# Patient Record
Sex: Female | Born: 1955
Health system: Southern US, Community
[De-identification: ages and names within clinical notes are randomized; demographics above are authoritative.]

## PROBLEM LIST (undated history)

## (undated) DIAGNOSIS — F419 Anxiety disorder, unspecified: Secondary | ICD-10-CM

## (undated) DIAGNOSIS — F32A Depression, unspecified: Secondary | ICD-10-CM

## (undated) DIAGNOSIS — G40909 Epilepsy, unspecified, not intractable, without status epilepticus: Secondary | ICD-10-CM

## (undated) DIAGNOSIS — G4700 Insomnia, unspecified: Secondary | ICD-10-CM

## (undated) DIAGNOSIS — E039 Hypothyroidism, unspecified: Secondary | ICD-10-CM

## (undated) DIAGNOSIS — E785 Hyperlipidemia, unspecified: Secondary | ICD-10-CM

## (undated) DIAGNOSIS — I10 Essential (primary) hypertension: Secondary | ICD-10-CM

## (undated) DIAGNOSIS — F329 Major depressive disorder, single episode, unspecified: Secondary | ICD-10-CM

## (undated) DIAGNOSIS — E0520 Thyrotoxicosis with toxic multinodular goiter without thyrotoxic crisis or storm: Secondary | ICD-10-CM

## (undated) HISTORY — DX: Hyperlipidemia, unspecified: E78.5

## (undated) HISTORY — DX: Depression, unspecified: F32.A

## (undated) HISTORY — PX: THYROIDECTOMY: SHX17

## (undated) HISTORY — DX: Insomnia, unspecified: G47.00

## (undated) HISTORY — DX: Major depressive disorder, single episode, unspecified: F32.9

## (undated) HISTORY — DX: Hypothyroidism, unspecified: E03.9

## (undated) HISTORY — DX: Anxiety disorder, unspecified: F41.9

---

## 2003-10-17 ENCOUNTER — Emergency Department (HOSPITAL_COMMUNITY): Admission: EM | Admit: 2003-10-17 | Discharge: 2003-10-17 | Payer: Self-pay | Admitting: Emergency Medicine

## 2005-04-28 ENCOUNTER — Ambulatory Visit: Payer: Self-pay | Admitting: Family Medicine

## 2005-05-26 ENCOUNTER — Encounter (HOSPITAL_COMMUNITY): Admission: RE | Admit: 2005-05-26 | Discharge: 2005-06-25 | Payer: Self-pay | Admitting: Family Medicine

## 2005-05-29 ENCOUNTER — Emergency Department (HOSPITAL_COMMUNITY): Admission: EM | Admit: 2005-05-29 | Discharge: 2005-05-29 | Payer: Self-pay | Admitting: Emergency Medicine

## 2005-07-01 ENCOUNTER — Emergency Department (HOSPITAL_COMMUNITY): Admission: EM | Admit: 2005-07-01 | Discharge: 2005-07-01 | Payer: Self-pay | Admitting: Emergency Medicine

## 2005-11-13 ENCOUNTER — Emergency Department: Payer: Self-pay | Admitting: Emergency Medicine

## 2006-02-22 ENCOUNTER — Emergency Department: Payer: Self-pay | Admitting: Emergency Medicine

## 2006-03-02 ENCOUNTER — Ambulatory Visit: Payer: Self-pay | Admitting: Otolaryngology

## 2006-03-07 ENCOUNTER — Other Ambulatory Visit: Payer: Self-pay

## 2006-03-23 ENCOUNTER — Ambulatory Visit: Payer: Self-pay | Admitting: Otolaryngology

## 2006-04-13 ENCOUNTER — Emergency Department: Payer: Self-pay | Admitting: Emergency Medicine

## 2006-07-26 ENCOUNTER — Emergency Department: Payer: Self-pay | Admitting: Unknown Physician Specialty

## 2006-07-30 ENCOUNTER — Other Ambulatory Visit: Payer: Self-pay

## 2006-07-30 ENCOUNTER — Observation Stay: Payer: Self-pay | Admitting: *Deleted

## 2007-02-15 ENCOUNTER — Emergency Department: Payer: Self-pay | Admitting: Emergency Medicine

## 2007-09-02 ENCOUNTER — Observation Stay: Payer: Self-pay | Admitting: Internal Medicine

## 2007-09-06 ENCOUNTER — Emergency Department: Payer: Self-pay | Admitting: Emergency Medicine

## 2007-12-10 ENCOUNTER — Emergency Department (HOSPITAL_COMMUNITY): Admission: EM | Admit: 2007-12-10 | Discharge: 2007-12-11 | Payer: Self-pay | Admitting: Emergency Medicine

## 2008-10-06 ENCOUNTER — Inpatient Hospital Stay (HOSPITAL_COMMUNITY): Admission: EM | Admit: 2008-10-06 | Discharge: 2008-10-06 | Payer: Self-pay | Admitting: Emergency Medicine

## 2008-10-07 ENCOUNTER — Emergency Department: Payer: Self-pay | Admitting: Emergency Medicine

## 2008-10-10 ENCOUNTER — Emergency Department (HOSPITAL_COMMUNITY): Admission: EM | Admit: 2008-10-10 | Discharge: 2008-10-10 | Payer: Self-pay | Admitting: Emergency Medicine

## 2008-12-15 ENCOUNTER — Emergency Department: Payer: Self-pay | Admitting: Emergency Medicine

## 2009-01-08 ENCOUNTER — Ambulatory Visit: Payer: Self-pay | Admitting: Neurology

## 2009-03-05 ENCOUNTER — Emergency Department (HOSPITAL_COMMUNITY): Admission: EM | Admit: 2009-03-05 | Discharge: 2009-03-06 | Payer: Self-pay | Admitting: Emergency Medicine

## 2009-09-29 ENCOUNTER — Emergency Department (HOSPITAL_COMMUNITY): Admission: EM | Admit: 2009-09-29 | Discharge: 2009-09-30 | Payer: Self-pay | Admitting: Emergency Medicine

## 2009-10-01 ENCOUNTER — Emergency Department (HOSPITAL_COMMUNITY): Admission: EM | Admit: 2009-10-01 | Discharge: 2009-10-01 | Payer: Self-pay | Admitting: Emergency Medicine

## 2010-02-27 ENCOUNTER — Emergency Department (HOSPITAL_COMMUNITY): Admission: EM | Admit: 2010-02-27 | Discharge: 2010-02-27 | Payer: Self-pay | Admitting: Emergency Medicine

## 2010-03-02 ENCOUNTER — Emergency Department: Payer: Self-pay | Admitting: Emergency Medicine

## 2010-05-04 ENCOUNTER — Ambulatory Visit (HOSPITAL_COMMUNITY): Admission: RE | Admit: 2010-05-04 | Discharge: 2010-05-04 | Payer: Self-pay | Admitting: Internal Medicine

## 2010-05-15 ENCOUNTER — Emergency Department: Payer: Self-pay | Admitting: Emergency Medicine

## 2010-05-16 ENCOUNTER — Emergency Department (HOSPITAL_COMMUNITY): Admission: EM | Admit: 2010-05-16 | Discharge: 2010-05-16 | Payer: Self-pay | Admitting: Emergency Medicine

## 2010-07-08 ENCOUNTER — Ambulatory Visit: Payer: Self-pay | Admitting: Surgery

## 2010-07-12 ENCOUNTER — Ambulatory Visit: Payer: Self-pay | Admitting: Surgery

## 2010-10-16 LAB — URINALYSIS, ROUTINE W REFLEX MICROSCOPIC
Bilirubin Urine: NEGATIVE
Glucose, UA: NEGATIVE mg/dL
Ketones, ur: NEGATIVE mg/dL
Nitrite: NEGATIVE
Protein, ur: NEGATIVE mg/dL
Specific Gravity, Urine: 1.015 (ref 1.005–1.030)
Urobilinogen, UA: 0.2 mg/dL (ref 0.0–1.0)
pH: 5.5 (ref 5.0–8.0)

## 2010-10-16 LAB — DIFFERENTIAL
Basophils Absolute: 0.1 10*3/uL (ref 0.0–0.1)
Basophils Relative: 1 % (ref 0–1)
Eosinophils Absolute: 0.2 10*3/uL (ref 0.0–0.7)
Eosinophils Relative: 2 % (ref 0–5)
Lymphocytes Relative: 21 % (ref 12–46)
Lymphs Abs: 2.3 10*3/uL (ref 0.7–4.0)
Monocytes Absolute: 0.9 10*3/uL (ref 0.1–1.0)
Monocytes Relative: 8 % (ref 3–12)
Neutro Abs: 7.3 10*3/uL (ref 1.7–7.7)
Neutrophils Relative %: 68 % (ref 43–77)

## 2010-10-16 LAB — LIPASE, BLOOD: Lipase: 29 U/L (ref 11–59)

## 2010-10-16 LAB — CBC
HCT: 37.4 % (ref 36.0–46.0)
Hemoglobin: 13.1 g/dL (ref 12.0–15.0)
MCH: 31.9 pg (ref 26.0–34.0)
MCHC: 35.1 g/dL (ref 30.0–36.0)
MCV: 91 fL (ref 78.0–100.0)
Platelets: 230 10*3/uL (ref 150–400)
RBC: 4.11 MIL/uL (ref 3.87–5.11)
RDW: 12.7 % (ref 11.5–15.5)
WBC: 10.7 10*3/uL — ABNORMAL HIGH (ref 4.0–10.5)

## 2010-10-16 LAB — COMPREHENSIVE METABOLIC PANEL
ALT: 26 U/L (ref 0–35)
AST: 23 U/L (ref 0–37)
Albumin: 3.9 g/dL (ref 3.5–5.2)
Alkaline Phosphatase: 37 U/L — ABNORMAL LOW (ref 39–117)
BUN: 9 mg/dL (ref 6–23)
CO2: 26 mEq/L (ref 19–32)
Calcium: 8.7 mg/dL (ref 8.4–10.5)
Chloride: 101 mEq/L (ref 96–112)
Creatinine, Ser: 0.87 mg/dL (ref 0.4–1.2)
GFR calc Af Amer: >60 mL/min (ref >60)
GFR calc non Af Amer: >60 mL/min (ref >60)
Glucose, Bld: 124 mg/dL — ABNORMAL HIGH (ref 70–99)
Potassium: 3.8 mEq/L (ref 3.5–5.1)
Sodium: 134 mEq/L — ABNORMAL LOW (ref 135–145)
Total Bilirubin: 0.4 mg/dL (ref 0.3–1.2)
Total Protein: 7.1 g/dL (ref 6.0–8.3)

## 2010-10-16 LAB — URINE MICROSCOPIC-ADD ON

## 2010-10-16 LAB — URINE CULTURE: Colony Count: >=100000

## 2010-10-16 LAB — POCT PREGNANCY, URINE: Preg Test, Ur: NEGATIVE

## 2010-10-24 ENCOUNTER — Emergency Department (HOSPITAL_COMMUNITY)
Admission: EM | Admit: 2010-10-24 | Discharge: 2010-10-24 | Disposition: A | Discharge status: Home / Self Care | Payer: Medicare Other | Attending: Emergency Medicine | Admitting: Emergency Medicine

## 2010-10-24 ENCOUNTER — Emergency Department (HOSPITAL_COMMUNITY): Payer: Medicare Other

## 2010-10-24 DIAGNOSIS — Z79899 Other long term (current) drug therapy: Secondary | ICD-10-CM | POA: Insufficient documentation

## 2010-10-24 DIAGNOSIS — E1169 Type 2 diabetes mellitus with other specified complication: Secondary | ICD-10-CM | POA: Insufficient documentation

## 2010-10-24 DIAGNOSIS — R002 Palpitations: Secondary | ICD-10-CM | POA: Insufficient documentation

## 2010-10-24 DIAGNOSIS — E039 Hypothyroidism, unspecified: Secondary | ICD-10-CM | POA: Insufficient documentation

## 2010-10-24 DIAGNOSIS — I10 Essential (primary) hypertension: Secondary | ICD-10-CM | POA: Insufficient documentation

## 2010-10-24 LAB — BASIC METABOLIC PANEL
BUN: 13 mg/dL (ref 6–23)
CO2: 27 mEq/L (ref 19–32)
Calcium: 8.8 mg/dL (ref 8.4–10.5)
GFR calc Af Amer: >60 mL/min (ref >60)
GFR calc non Af Amer: >60 mL/min (ref >60)
Glucose, Bld: 226 mg/dL — ABNORMAL HIGH (ref 70–99)
Potassium: 3.9 mEq/L (ref 3.5–5.1)
Sodium: 134 mEq/L — ABNORMAL LOW (ref 135–145)

## 2010-10-24 LAB — CBC
HCT: 38.3 % (ref 36.0–46.0)
Hemoglobin: 13.5 g/dL (ref 12.0–15.0)
MCH: 30 pg (ref 26.0–34.0)
MCHC: 35.2 g/dL (ref 30.0–36.0)
MCV: 85.1 fL (ref 78.0–100.0)
RBC: 4.5 MIL/uL (ref 3.87–5.11)
RDW: 12.5 % (ref 11.5–15.5)

## 2010-10-24 LAB — DIFFERENTIAL
Basophils Absolute: 0 10*3/uL (ref 0.0–0.1)
Basophils Relative: 1 % (ref 0–1)
Eosinophils Absolute: 0.2 10*3/uL (ref 0.0–0.7)
Lymphocytes Relative: 33 % (ref 12–46)
Monocytes Absolute: 0.6 10*3/uL (ref 0.1–1.0)
Neutro Abs: 4.3 10*3/uL (ref 1.7–7.7)
Neutrophils Relative %: 57 % (ref 43–77)

## 2010-10-24 LAB — POCT CARDIAC MARKERS
Myoglobin, poc: 14.1 ng/mL (ref 12–200)
Troponin i, poc: <0.05 ng/mL (ref 0.00–0.09)

## 2010-11-11 LAB — COMPREHENSIVE METABOLIC PANEL
ALT: 15 U/L (ref 0–35)
Albumin: 3.9 g/dL (ref 3.5–5.2)
Alkaline Phosphatase: 36 U/L — ABNORMAL LOW (ref 39–117)
BUN: 16 mg/dL (ref 6–23)
Chloride: 106 mEq/L (ref 96–112)
Glucose, Bld: 115 mg/dL — ABNORMAL HIGH (ref 70–99)
Potassium: 3.6 mEq/L (ref 3.5–5.1)
Sodium: 143 mEq/L (ref 135–145)
Total Bilirubin: 0.5 mg/dL (ref 0.3–1.2)

## 2010-11-11 LAB — DIFFERENTIAL
Basophils Absolute: 0 10*3/uL (ref 0.0–0.1)
Basophils Absolute: 0.1 10*3/uL (ref 0.0–0.1)
Basophils Relative: 1 % (ref 0–1)
Basophils Relative: 2 % — ABNORMAL HIGH (ref 0–1)
Eosinophils Absolute: 0.2 10*3/uL (ref 0.0–0.7)
Eosinophils Absolute: 0.1 10*3/uL (ref 0.0–0.7)
Eosinophils Relative: 3 % (ref 0–5)
Lymphocytes Relative: 33 % (ref 12–46)
Lymphs Abs: 1.9 10*3/uL (ref 0.7–4.0)
Monocytes Absolute: 0.4 10*3/uL (ref 0.1–1.0)
Monocytes Absolute: 0.3 10*3/uL (ref 0.1–1.0)
Monocytes Relative: 7 % (ref 3–12)
Monocytes Relative: 7 % (ref 3–12)
Neutro Abs: 3.3 10*3/uL (ref 1.7–7.7)
Neutro Abs: 2.2 10*3/uL (ref 1.7–7.7)
Neutrophils Relative %: 57 % (ref 43–77)

## 2010-11-11 LAB — CBC
HCT: 35.8 % — ABNORMAL LOW (ref 36.0–46.0)
HCT: 33.5 % — ABNORMAL LOW (ref 36.0–46.0)
Hemoglobin: 12.9 g/dL (ref 12.0–15.0)
Hemoglobin: 11.9 g/dL — ABNORMAL LOW (ref 12.0–15.0)
MCHC: 35.3 g/dL (ref 30.0–36.0)
MCV: 93.1 fL (ref 78.0–100.0)
Platelets: 199 10*3/uL (ref 150–400)
Platelets: 170 10*3/uL (ref 150–400)
RBC: 3.85 MIL/uL — ABNORMAL LOW (ref 3.87–5.11)
RDW: 13.9 % (ref 11.5–15.5)
WBC: 5.8 10*3/uL (ref 4.0–10.5)
WBC: 4.5 10*3/uL (ref 4.0–10.5)

## 2010-11-11 LAB — BASIC METABOLIC PANEL
BUN: 14 mg/dL (ref 6–23)
CO2: 28 mEq/L (ref 19–32)
Calcium: 9.3 mg/dL (ref 8.4–10.5)
Chloride: 103 mEq/L (ref 96–112)
Creatinine, Ser: 1.15 mg/dL (ref 0.4–1.2)
GFR calc Af Amer: >60 mL/min (ref >60)
GFR calc non Af Amer: 50 mL/min — ABNORMAL LOW (ref >60)
Glucose, Bld: 107 mg/dL — ABNORMAL HIGH (ref 70–99)
Potassium: 3.4 mEq/L — ABNORMAL LOW (ref 3.5–5.1)
Sodium: 139 mEq/L (ref 135–145)

## 2010-11-11 LAB — POCT CARDIAC MARKERS
CKMB, poc: 3.3 ng/mL (ref 1.0–8.0)
Myoglobin, poc: 37 ng/mL (ref 12–200)
Troponin i, poc: <0.05 ng/mL (ref 0.00–0.09)

## 2010-11-11 LAB — RAPID URINE DRUG SCREEN, HOSP PERFORMED
Amphetamines: NOT DETECTED
Benzodiazepines: NOT DETECTED
Cocaine: NOT DETECTED
Opiates: POSITIVE — AB
Tetrahydrocannabinol: NOT DETECTED

## 2010-11-11 LAB — URINALYSIS, ROUTINE W REFLEX MICROSCOPIC
Bilirubin Urine: NEGATIVE
Hgb urine dipstick: NEGATIVE
Ketones, ur: NEGATIVE mg/dL
Nitrite: NEGATIVE
Urobilinogen, UA: 0.2 mg/dL (ref 0.0–1.0)

## 2010-11-11 LAB — URINE MICROSCOPIC-ADD ON

## 2010-11-11 LAB — ETHANOL: Alcohol, Ethyl (B): <5 mg/dL (ref 0–10)

## 2010-12-14 NOTE — H&P (Signed)
Sherry Fields, Sherry Fields NO.:  [id number]   MEDICAL RECORD NO.:  [id number]          PATIENT TYPE:  INP   LOCATION:  A309                          FACILITY:  [facility]   PHYSICIAN:  Osvaldo Shipper, MD     DATE OF BIRTH:  07-26-1956   DATE OF ADMISSION:  10/06/2008  DATE OF DISCHARGE:  03/08/2010LH                              HISTORY & PHYSICAL   ADDENDUM:  The patient underwent CT of the head as mentioned on my other  dictation and this revealed two areas of hypoattenuation on the right  side of the brain.  The radiologist felt that this could be  calcification or on the other hand could be petechial hemorrhages.  A  short follow-up was recommended with CT head the next 12-24 hours.  The  patient was very hypertensive.  In view of this finding. I gave orders  to transfer the patient to the intensive care unit.  In view of the  possible QT prolongation on the EKG,  I also ordered a cardiology  consultation.  A neurology consultation was also obtained because of the  abnormal CT findings.  Consideration was also being given to getting an  MRI in the morning.  However, I got a call from the nursing station on  the third floor saying that the patient's family wanted to talk to me.  I spoke to the patient's daughter who then went on to question as to why  this patient was admitted.  I explained to her the abnormal CT findings  and her presentation and told her that this could be quite concerning  and dangerous for the patient.  She then demanded to see the report of  the CT scan which I was able to provide.  She demanded to see the  imaging studies and so I showed her the two spots the radiologist was  concerned about.  She then demanded to speak with the radiologist.  I  have told her that that would not be possible since the radiologist was  located in a remote site.  She said why we could not repeat the CT right  away to see if these findings were there or not.  I told her  that we  want to rule out evolution of these abnormalities and the radiologist  recommended follow-up in 12 hours.  The daughter then went on to mention  that they have lost many family members to this hospital.  your  hospital has killed many of our relatives.  She then said that if we  take the patient to [place] and the CT scan  does not show these  findings that we have found here, can we then sue you?  The patient's  daughter seems to be quite confrontational.  I tried to reason with her  and tried to explain as to why we wanted to keep the patient in the  intensive care unit and why she was admitted, but the daughter refused  to reason.  I have offered her that if she did not feel comfortable with  the care  in this hospital, I can try calling folks at [facility].  After  much thought and discussion between other family members and between the  daughter and the patient, they said that they will stay back in this  hospital.  And then I got a call about 10 minutes later from the nurse  that they wanted to leave against medical advice and they did not wish  to speak to me any further.  The consequences of doing so were explained  to them both by myself and by the nurse taking care of the patient.      Osvaldo Shipper, MD  Electronically Signed     GK/MEDQ  D:  10/07/2008  T:  10/07/2008  Job:  [id number]

## 2010-12-14 NOTE — H&P (Signed)
Sherry Fields, IACOVELLI NO.:  [id number]   MEDICAL RECORD NO.:  [id number]          PATIENT TYPE:  INP   LOCATION:  A309                          FACILITY:  [facility]   PHYSICIAN:  Osvaldo Shipper, MD     DATE OF BIRTH:  Sep 22, 1955   DATE OF ADMISSION:  10/06/2008  DATE OF DISCHARGE:  LH                              HISTORY & PHYSICAL   The patient's PMD is Dr. Carmelia Roller in [place].   ADMISSION DIAGNOSES:  1. Syncope, unclear etiology.  2. Right hand numbness, unclear etiology.  3. History of hypothyroidism secondary to surgery.  4. Overweight.  5. Possible prolonged QT interval.   CHIEF COMPLAINT:  Passed out.   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  She was in her usual  state of health until this afternoon when she was in her living room.  She was sitting on the couch.  She got up, felt a little dizzy, went to  the bathroom, urinated and then she walked to the kitchen and when she  was walking back to the living room she felt dizzy again and passed out.  Denies any chest pain, shortness of breath, palpitations.  Denies any  seizure-type activity.  There was family present at the room when this  happened and they did not report anything according to the patient.  She  denied any fever, chills, nausea, vomiting, diarrhea.  Denies any vision  loss, weight loss.  She does admit to weight gain over the past many  months.  She denies any focal weakness, however has been experiencing  right hand numbness for the last 4-5 days.  She describes the numbness  in all of her fingers with sharp neuropathic pain but no pain in the  wrist and no pain in the forearm.   MEDICATIONS AT HOME:  Levothyroxine 125 mcg once a day.  She has been on  this for a week.   She said she lost insurance and so could not afford to  take it and then about a week ago her PMD put her back on this  medication.  She denies taking any other medications at home.   ALLERGIES:  No known drug allergies.   PAST MEDICAL HISTORY:  Positive for a thyroidectomy for toxic nodular  goiter done in 2009 and she is currently on thyroid replacement.  Denies  any other surgeries.  No other medical history in the past.  Denies any  heart disease, lung disease, strokes, anything of that nature in the  past.  Denies any history of diabetes.   SOCIAL HISTORY:  Lives in [place] with her daughter and son.  No  smoking.  Occasional alcohol use.  No illicit drug use.  Does not work.  Ambulatory and independent otherwise.   FAMILY HISTORY:  Positive for diabetes, hypertension.   REVIEW OF SYSTEMS:  GENERAL:  Positive for weakness, malaise.  Continues  to have a feeling of lethargy.  HEENT:  Unremarkable.  CARDIOVASCULAR:  As in HPI.  GI:  Unremarkable.  GU:  Unremarkable.  RESPIRATORY:  As in  HPI.  MUSCULOSKELETAL:  Unremarkable.  NEUROLOGICAL:  Positive for mild  headache in the frontal area but denies any vision changes, otherwise as  in HPI.  Also the numbness as in HPI.  PSYCHIATRIC:  Unremarkable.  DERMATOLOGICAL:  Unremarkable.   PHYSICAL EXAMINATION:  VITAL SIGNS:  Temperature 98.0, blood pressure  when she came in was 146/99, it increased to 176/107, heart rate in the  70s, regular, respiratory rate is 22, saturation 96% on room air.  GENERAL EXAM:  An overweight, obese white female in no distress.  HEENT: The head is atraumatic, no other concerning lesions noted and no  pallor, no icterus.  Pupils are equal reacting.  No nystagmus noted.  Tongue is midline.  No oral lesions are noted.  NECK:  Soft and supple.  No thyromegaly is appreciated.  LUNGS:  Clear to auscultation bilaterally.  No wheezing, rales or  rhonchi.  CARDIOVASCULAR:  S1, S2 is normal.  Rate regular.   No murmurs  appreciated.  No S3, S4.  No rubs.  No bruits.  ABDOMEN:  Soft, nontender, nondistended.  Bowel sounds are present.  No  masses or organomegaly is appreciated.  EXTREMITIES:  Show actually minimal pedal edema bilaterally.  MUSCULOSKELETAL:  Exam otherwise unremarkable.  NEUROLOGIC:  She is alert, oriented x3.  No focal neurological deficits  present.  Examination of the right upper extremity does not reveal any  swelling per se.  No obvious deformity noted.  Extension of the wrist  does not produce any pain in the fingers and the numbness she mentioned  is across all fingers.  Sensory exam does reveal some evidence for some  deficit in the fingers, though it was a tough exam.   LABORATORIES:  CBC shows a normal white count, hemoglobin is 12.9,  platelet count is 199.  Potassium 3.4, glucose 107, BUN, creatinine is  normal.  Cardiac markers are negative x1.  Chest x-ray shows no acute  cardiopulmonary process.  EKG shows a sinus rhythm with possibly normal  axis.  PR interval is normal.  I am suspecting a prolonged QT especially  if you look in lead I and II.  Lead V5 has a very wavering baseline, so  possible prolonged QT though it is a tough EKG.  No other acute changes  are noted.   ASSESSMENT:  This is a 55 year old Caucasian female who presents with  syncope.  Differential diagnosis includes orthostatic hypotension.  Considering her right hand numbness, she could have some intracranial  process as well.  This could be cardiogenic considering her abnormal  electrocardiogram.   PLAN:  1. Syncope.  We will repeat electrocardiogram.  We will cycle her      cardiac enzymes.  Get a CT head as it has not been done yet.  If      the CT is negative MRI of the brain will have to be pursued as I am      really concerned about the right hand numbness.  Orthostatics will      be checked.  This does not sound like seizure activity, especially      considering the fact that she did  have witnesses present, but if      all other workup is negative, electroencephalogram will have  to be      considered. Echocardiogram, carotid Dopplers will also be checked.  2. Thyroid deficiency.  We will go ahead and check TSH and free T4 to      make sure she is being replaced adequately.  She has been started      on a very high dose but she of course does not have a thyroid, so      we want to make sure that she is not being over corrected here.  3. Hypokalemia.  She will be replaced.  Magnesium level will be      checked.  4. Further management decisions will depend on results of further      testing and patient's response to treatment.   Also note that possible prolonged QT.  We will repeat electrocardiogram  in the morning and if it continues to show this abnormality, we will  consult Cardiology to see her.      Osvaldo Shipper, MD  Electronically Signed     GK/MEDQ  D:  10/06/2008  T:  10/06/2008  Job:  [id number]   cc:   Carmelia Roller, Dr.  Lewayne Bunting

## 2011-01-13 ENCOUNTER — Emergency Department (HOSPITAL_COMMUNITY)
Admission: EM | Admit: 2011-01-13 | Discharge: 2011-01-13 | Discharge status: Against Medical Advice | Payer: Medicare Other | Attending: Emergency Medicine | Admitting: Emergency Medicine

## 2011-01-13 ENCOUNTER — Emergency Department (HOSPITAL_COMMUNITY): Payer: Medicare Other

## 2011-01-13 ENCOUNTER — Emergency Department (HOSPITAL_COMMUNITY)
Admission: EM | Admit: 2011-01-13 | Discharge: 2011-01-13 | Disposition: A | Discharge status: Home / Self Care | Payer: Medicare Other | Attending: Emergency Medicine | Admitting: Emergency Medicine

## 2011-01-13 DIAGNOSIS — I10 Essential (primary) hypertension: Secondary | ICD-10-CM | POA: Insufficient documentation

## 2011-01-13 DIAGNOSIS — R0989 Other specified symptoms and signs involving the circulatory and respiratory systems: Secondary | ICD-10-CM | POA: Insufficient documentation

## 2011-01-13 DIAGNOSIS — E119 Type 2 diabetes mellitus without complications: Secondary | ICD-10-CM | POA: Insufficient documentation

## 2011-01-13 DIAGNOSIS — T7840XA Allergy, unspecified, initial encounter: Secondary | ICD-10-CM | POA: Insufficient documentation

## 2011-01-13 DIAGNOSIS — R221 Localized swelling, mass and lump, neck: Secondary | ICD-10-CM | POA: Insufficient documentation

## 2011-01-13 DIAGNOSIS — H5789 Other specified disorders of eye and adnexa: Secondary | ICD-10-CM | POA: Insufficient documentation

## 2011-01-13 DIAGNOSIS — E039 Hypothyroidism, unspecified: Secondary | ICD-10-CM | POA: Insufficient documentation

## 2011-01-13 DIAGNOSIS — R220 Localized swelling, mass and lump, head: Secondary | ICD-10-CM | POA: Insufficient documentation

## 2011-01-13 DIAGNOSIS — Z79899 Other long term (current) drug therapy: Secondary | ICD-10-CM | POA: Insufficient documentation

## 2011-01-13 DIAGNOSIS — R42 Dizziness and giddiness: Secondary | ICD-10-CM | POA: Insufficient documentation

## 2011-01-13 DIAGNOSIS — R079 Chest pain, unspecified: Secondary | ICD-10-CM | POA: Insufficient documentation

## 2011-01-13 DIAGNOSIS — R0609 Other forms of dyspnea: Secondary | ICD-10-CM | POA: Insufficient documentation

## 2011-01-13 LAB — DIFFERENTIAL
Basophils Absolute: 0 10*3/uL (ref 0.0–0.1)
Basophils Relative: 0 % (ref 0–1)
Eosinophils Absolute: 0.2 10*3/uL (ref 0.0–0.7)
Neutro Abs: 5.6 10*3/uL (ref 1.7–7.7)
Neutrophils Relative %: 60 % (ref 43–77)

## 2011-01-13 LAB — CBC
Hemoglobin: 14.1 g/dL (ref 12.0–15.0)
MCH: 30.1 pg (ref 26.0–34.0)
Platelets: 238 10*3/uL (ref 150–400)
RBC: 4.69 MIL/uL (ref 3.87–5.11)
WBC: 9.2 10*3/uL (ref 4.0–10.5)

## 2011-01-13 LAB — CK TOTAL AND CKMB (NOT AT ARMC)
CK, MB: 1.6 ng/mL (ref 0.3–4.0)
Relative Index: INVALID (ref 0.0–2.5)
Total CK: 76 U/L (ref 7–177)

## 2011-01-13 LAB — TROPONIN I: Troponin I: <0.3 ng/mL (ref <0.30)

## 2011-01-13 LAB — BASIC METABOLIC PANEL
CO2: 28 mEq/L (ref 19–32)
Calcium: 10.2 mg/dL (ref 8.4–10.5)
Glucose, Bld: 127 mg/dL — ABNORMAL HIGH (ref 70–99)
Potassium: 3.8 mEq/L (ref 3.5–5.1)
Sodium: 137 mEq/L (ref 135–145)

## 2011-01-13 LAB — GLUCOSE, CAPILLARY: Glucose-Capillary: 128 mg/dL — ABNORMAL HIGH (ref 70–99)

## 2011-03-29 ENCOUNTER — Encounter: Payer: Self-pay | Admitting: Emergency Medicine

## 2011-03-29 ENCOUNTER — Emergency Department (HOSPITAL_COMMUNITY)
Admission: EM | Admit: 2011-03-29 | Discharge: 2011-03-30 | Disposition: A | Discharge status: Home / Self Care | Payer: Medicare Other | Attending: Emergency Medicine | Admitting: Emergency Medicine

## 2011-03-29 DIAGNOSIS — J029 Acute pharyngitis, unspecified: Secondary | ICD-10-CM | POA: Insufficient documentation

## 2011-03-29 DIAGNOSIS — I10 Essential (primary) hypertension: Secondary | ICD-10-CM | POA: Insufficient documentation

## 2011-03-29 DIAGNOSIS — E079 Disorder of thyroid, unspecified: Secondary | ICD-10-CM | POA: Insufficient documentation

## 2011-03-29 DIAGNOSIS — E119 Type 2 diabetes mellitus without complications: Secondary | ICD-10-CM | POA: Insufficient documentation

## 2011-03-29 HISTORY — DX: Essential (primary) hypertension: I10

## 2011-03-29 NOTE — ED Notes (Signed)
Pt states has hx of allergic reactions and has been prescribed an epi pen. Pt states had allergic like reactions this evening with tongue, lips swelling and has had a sore throat x 4 days.  Pt states "took epi pen around 9 pm tonight, and swelling is better ". Unknown allergen.  Pt's eyes are blood shot with periorbital swelling. Denies SOB, wheezing or respiratory distress.

## 2011-03-29 NOTE — ED Notes (Signed)
Patient states had a sore throat for a few weeks.  Patient states about an hour ago, her lips began swelling and she felt like her throat was closing; states used epi-pen and symptoms resolved.

## 2011-03-30 ENCOUNTER — Emergency Department (HOSPITAL_COMMUNITY)
Admission: EM | Admit: 2011-03-30 | Discharge: 2011-03-30 | Disposition: A | Discharge status: Home / Self Care | Payer: Medicare Other | Attending: Emergency Medicine | Admitting: Emergency Medicine

## 2011-03-30 ENCOUNTER — Encounter (HOSPITAL_COMMUNITY): Payer: Self-pay

## 2011-03-30 DIAGNOSIS — E079 Disorder of thyroid, unspecified: Secondary | ICD-10-CM | POA: Insufficient documentation

## 2011-03-30 DIAGNOSIS — K121 Other forms of stomatitis: Secondary | ICD-10-CM | POA: Insufficient documentation

## 2011-03-30 DIAGNOSIS — I10 Essential (primary) hypertension: Secondary | ICD-10-CM | POA: Insufficient documentation

## 2011-03-30 DIAGNOSIS — E119 Type 2 diabetes mellitus without complications: Secondary | ICD-10-CM | POA: Insufficient documentation

## 2011-03-30 MED ORDER — LIDOCAINE VISCOUS 2 % MT SOLN: OROMUCOSAL | Status: DC

## 2011-03-30 MED ORDER — LIDOCAINE VISCOUS 2 % MT SOLN
10.0000 mL | Freq: Once | OROMUCOSAL | Status: AC
  Administered 2011-03-30: 10 mL via OROMUCOSAL
  Filled 2011-03-30: qty 15

## 2011-03-30 MED ORDER — PENICILLIN G BENZATHINE 1200000 UNIT/2ML IM SUSP
1.2000 10*6.[IU] | Freq: Once | INTRAMUSCULAR | Status: AC
  Administered 2011-03-30: 1.2 10*6.[IU] via INTRAMUSCULAR
  Filled 2011-03-30: qty 2

## 2011-03-30 NOTE — ED Notes (Signed)
Pt has ?coating appearance to tongue area.

## 2011-03-30 NOTE — ED Provider Notes (Signed)
History     CSN: [id number] Arrival date & time: 03/29/2011 10:20 PM  Chief Complaint  Patient presents with  . Sore Throat  . Allergic Reaction   HPI Comments: Seen by her PCP in [place] last week and put on PCN for sore throat x 5 days.  "started feeling better and now i feel sick again."  Feels like she had an allergic reaction earlier today although she does not know to what.  She hgave herself an injection with her epi pen and feels better.  She has no documented allergies.  Patient is a 55 y.o. female presenting with pharyngitis and allergic reaction. The history is provided by the patient and the spouse. No language interpreter was used.  Sore Throat This is a new problem. The current episode started in the past 7 days. The problem occurs constantly. The problem has been gradually worsening. Associated symptoms include a sore throat. Pertinent negatives include no chest pain, coughing or fever. The symptoms are aggravated by swallowing. She has tried nothing for the symptoms.  Allergic Reaction The primary symptoms do not include wheezing, shortness of breath or cough.    Past Medical History  Diagnosis Date  . Hypertension   . Thyroid disease   . Diabetes mellitus     Past Surgical History  Procedure Date  . Thyroidectomy     No family history on file.  History  Substance Use Topics  . Smoking status: Never Smoker   . Smokeless tobacco: Current User    Types: Snuff  . Alcohol Use: No    OB History    Grav Para Term Preterm Abortions TAB SAB Ect Mult Living                  Review of Systems  Constitutional: Negative for fever.  HENT: Positive for sore throat. Negative for ear pain, drooling, trouble swallowing, neck stiffness and voice change.   Respiratory: Negative for cough, shortness of breath, wheezing and stridor.   Cardiovascular: Negative for chest pain.  All other systems reviewed and are negative.    Physical Exam  BP 123/78  Pulse 62   Temp(Src) 98.3 F (36.8 C) (Oral)  Resp 20  Ht 5\' 3"  (1.6 m)  Wt 170 lb (77.111 kg)  BMI 30.11 kg/m2  SpO2 94%  Physical Exam  Nursing note and vitals reviewed. Constitutional: She is oriented to person, place, and time. Vital signs are normal. She appears well-developed and well-nourished. No distress.  HENT:  Head: Normocephalic and atraumatic.  Right Ear: External ear normal.  Left Ear: External ear normal.  Nose: Nose normal.  Mouth/Throat: No oropharyngeal exudate.    Eyes: Conjunctivae and EOM are normal. Pupils are equal, round, and reactive to light. Right eye exhibits no discharge. Left eye exhibits no discharge. No scleral icterus.  Neck: Normal range of motion. Neck supple. No JVD present. No tracheal deviation present. No thyromegaly present.  Cardiovascular: Normal rate, regular rhythm, normal heart sounds, intact distal pulses and normal pulses.  Exam reveals no gallop and no friction rub.   No murmur heard. Pulmonary/Chest: Effort normal and breath sounds normal. No stridor. No respiratory distress. She has no wheezes. She has no rales. She exhibits no tenderness.  Abdominal: Soft. Normal appearance and bowel sounds are normal. She exhibits no distension and no mass. There is no tenderness. There is no rebound and no guarding.  Musculoskeletal: Normal range of motion. She exhibits no edema and no tenderness.  Lymphadenopathy:  She has no cervical adenopathy.  Neurological: She is alert and oriented to person, place, and time. She has normal reflexes. No cranial nerve deficit. Coordination normal. GCS eye subscore is 4. GCS verbal subscore is 5. GCS motor subscore is 6.  Reflex Scores:      Tricep reflexes are 2+ on the right side and 2+ on the left side.      Bicep reflexes are 2+ on the right side and 2+ on the left side.      Brachioradialis reflexes are 2+ on the right side and 2+ on the left side.      Patellar reflexes are 2+ on the right side and 2+ on the left  side.      Achilles reflexes are 2+ on the right side and 2+ on the left side. Skin: Skin is warm and dry. No rash noted. She is not diaphoretic.  Psychiatric: She has a normal mood and affect. Her speech is normal and behavior is normal. Judgment and thought content normal. Cognition and memory are normal.    ED Course  Procedures  MDM       Worthy Rancher, PA 03/30/11 [phone]

## 2011-03-30 NOTE — ED Notes (Signed)
Pt received PCN shot last pm. Pt woke up this am with "mouth sores". No other symptoms present

## 2011-03-30 NOTE — ED Provider Notes (Signed)
Medical screening examination/treatment/procedure(s) were performed by non-physician practitioner and as supervising physician I was immediately available for consultation/collaboration.  Shelda Jakes, MD 03/30/11 [phone]

## 2011-03-30 NOTE — ED Provider Notes (Signed)
History     CSN: [id number] Arrival date & time: 03/30/2011  5:36 PM  Chief Complaint  Patient presents with  . Mouth Lesions   HPI Comments: Pt states she had been bothered with  Cough, nasal congestion, and sore throat. She was treated last night 8/28 with IM antibiotics. Today she noted blisters and sores in the mouth and on the tongue. She request to be evaluated.  Patient is a 55 y.o. female presenting with mouth sores. The history is provided by the patient.  Mouth Lesions  The current episode started yesterday. The problem occurs continuously. The problem has been unchanged. The problem is moderate. The symptoms are relieved by nothing. The symptoms are aggravated by eating. Associated symptoms include congestion, mouth sores and rhinorrhea. Pertinent negatives include no orthopnea, no fever, no photophobia, no abdominal pain, no ear pain, no neck pain, no cough, no URI, no wheezing, no eye discharge and no eye redness.    Past Medical History  Diagnosis Date  . Hypertension   . Thyroid disease   . Diabetes mellitus     Past Surgical History  Procedure Date  . Thyroidectomy     No family history on file.  History  Substance Use Topics  . Smoking status: Never Smoker   . Smokeless tobacco: Current User    Types: Snuff  . Alcohol Use: No    OB History    Grav Para Term Preterm Abortions TAB SAB Ect Mult Living                  Review of Systems  Constitutional: Negative for fever and activity change.       All ROS Neg except as noted in HPI  HENT: Positive for congestion, rhinorrhea and mouth sores. Negative for ear pain, nosebleeds and neck pain.   Eyes: Negative for photophobia, discharge and redness.  Respiratory: Negative for cough, shortness of breath and wheezing.   Cardiovascular: Negative for chest pain, palpitations and orthopnea.  Gastrointestinal: Negative for abdominal pain and blood in stool.  Genitourinary: Negative for dysuria, frequency and  hematuria.  Musculoskeletal: Negative for back pain and arthralgias.  Skin: Negative.   Neurological: Negative for dizziness, seizures and speech difficulty.  Psychiatric/Behavioral: Negative for hallucinations and confusion.    Physical Exam  BP 123/80  Pulse 91  Temp(Src) 98 F (36.7 C) (Oral)  Resp 16  SpO2 99%  Physical Exam  Nursing note and vitals reviewed. Constitutional: She is oriented to person, place, and time. She appears well-developed and well-nourished.  Non-toxic appearance.  HENT:  Head: Normocephalic.  Right Ear: Tympanic membrane and external ear normal.  Left Ear: Tympanic membrane and external ear normal.       Pt has a geographic tongue. There are multiple oral lesions of the gums, mucosa, and tongue.  Eyes: EOM and lids are normal. Pupils are equal, round, and reactive to light.  Neck: Normal range of motion. Neck supple. Carotid bruit is not present.  Cardiovascular: Normal rate, regular rhythm, normal heart sounds, intact distal pulses and normal pulses.   Pulmonary/Chest: Breath sounds normal. No respiratory distress.  Abdominal: Soft. Bowel sounds are normal. There is no tenderness. There is no guarding.  Musculoskeletal: Normal range of motion.  Lymphadenopathy:       Head (right side): No submandibular adenopathy present.       Head (left side): No submandibular adenopathy present.    She has no cervical adenopathy.  Neurological: She is alert and oriented to  person, place, and time. She has normal strength. No cranial nerve deficit or sensory deficit.  Skin: Skin is warm and dry.  Psychiatric: She has a normal mood and affect. Her speech is normal.    ED Course  Procedures  MDM I have reviewed nursing notes, vital signs, and all appropriate lab and imaging results for this patient.      Kathie Dike, [place] 03/30/11 [phone]

## 2011-05-03 NOTE — ED Provider Notes (Signed)
Medical screening examination/treatment/procedure(s) were performed by non-physician practitioner and as supervising physician I was immediately available for consultation/collaboration.  Nicoletta Dress. Colon Branch, MD 05/03/11 [phone]

## 2011-07-09 ENCOUNTER — Emergency Department (HOSPITAL_COMMUNITY): Payer: Medicare Other

## 2011-07-09 ENCOUNTER — Encounter (HOSPITAL_COMMUNITY): Payer: Self-pay | Admitting: *Deleted

## 2011-07-09 ENCOUNTER — Emergency Department (HOSPITAL_COMMUNITY)
Admission: EM | Admit: 2011-07-09 | Discharge: 2011-07-09 | Disposition: A | Discharge status: Home / Self Care | Payer: Medicare Other | Attending: Emergency Medicine | Admitting: Emergency Medicine

## 2011-07-09 DIAGNOSIS — L539 Erythematous condition, unspecified: Secondary | ICD-10-CM | POA: Insufficient documentation

## 2011-07-09 DIAGNOSIS — I10 Essential (primary) hypertension: Secondary | ICD-10-CM | POA: Insufficient documentation

## 2011-07-09 DIAGNOSIS — E119 Type 2 diabetes mellitus without complications: Secondary | ICD-10-CM | POA: Insufficient documentation

## 2011-07-09 DIAGNOSIS — M79673 Pain in unspecified foot: Secondary | ICD-10-CM

## 2011-07-09 DIAGNOSIS — L0390 Cellulitis, unspecified: Secondary | ICD-10-CM

## 2011-07-09 DIAGNOSIS — IMO0002 Reserved for concepts with insufficient information to code with codable children: Secondary | ICD-10-CM | POA: Insufficient documentation

## 2011-07-09 DIAGNOSIS — X58XXXA Exposure to other specified factors, initial encounter: Secondary | ICD-10-CM | POA: Insufficient documentation

## 2011-07-09 DIAGNOSIS — M79609 Pain in unspecified limb: Secondary | ICD-10-CM | POA: Insufficient documentation

## 2011-07-09 DIAGNOSIS — E079 Disorder of thyroid, unspecified: Secondary | ICD-10-CM | POA: Insufficient documentation

## 2011-07-09 MED ORDER — DOXYCYCLINE HYCLATE 100 MG PO TABS
100.0000 mg | ORAL_TABLET | Freq: Once | ORAL | Status: AC
  Administered 2011-07-09: 100 mg via ORAL
  Filled 2011-07-09: qty 1

## 2011-07-09 MED ORDER — HYDROCODONE-ACETAMINOPHEN 5-325 MG PO TABS: ORAL_TABLET | ORAL | Status: AC

## 2011-07-09 MED ORDER — NAPROXEN 500 MG PO TABS: 500.0000 mg | ORAL_TABLET | Freq: Two times a day (BID) | ORAL | Status: AC

## 2011-07-09 MED ORDER — DOXYCYCLINE HYCLATE 100 MG PO CAPS: 100.0000 mg | ORAL_CAPSULE | Freq: Two times a day (BID) | ORAL | Status: AC

## 2011-07-09 NOTE — ED Notes (Signed)
Pt presents with left foot pain x 2 days. Pt states swelling started this am. Pt denies injury. CMS intact. Pt able to walk on foot.

## 2011-07-09 NOTE — ED Provider Notes (Signed)
History     CSN: [id number] Arrival date & time: 07/09/2011  3:38 PM   First MD Initiated Contact with Patient 07/09/11 1540      Chief Complaint  Patient presents with  . Foot Pain    (Consider location/radiation/quality/duration/timing/severity/associated sxs/prior treatment) Patient is a 55 y.o. female presenting with lower extremity pain. The history is provided by the patient.  Foot Pain This is a new problem. The current episode started in the past 7 days. The problem occurs constantly. The problem has been unchanged. Associated symptoms include arthralgias and myalgias. Pertinent negatives include no abdominal pain, chest pain, chills, coughing, fatigue, fever, joint swelling, nausea, neck pain, numbness, rash, sore throat, vomiting or weakness. The symptoms are aggravated by walking and standing. She has tried nothing for the symptoms. The treatment provided no relief.    Past Medical History  Diagnosis Date  . Hypertension   . Thyroid disease   . Diabetes mellitus     Past Surgical History  Procedure Date  . Thyroidectomy     History reviewed. No pertinent family history.  History  Substance Use Topics  . Smoking status: Never Smoker   . Smokeless tobacco: Current User    Types: Snuff  . Alcohol Use: No    OB History    Grav Para Term Preterm Abortions TAB SAB Ect Mult Living                  Review of Systems  Constitutional: Negative for fever, chills, activity change, appetite change and fatigue.  HENT: Negative for sore throat, trouble swallowing, neck pain and neck stiffness.   Respiratory: Negative for cough, shortness of breath and wheezing.   Cardiovascular: Negative for chest pain and palpitations.  Gastrointestinal: Negative for nausea, vomiting, abdominal pain and blood in stool.  Genitourinary: Negative for dysuria, hematuria and flank pain.  Musculoskeletal: Positive for myalgias and arthralgias. Negative for back pain and joint swelling.    Skin: Positive for color change and wound. Negative for rash.  Neurological: Negative for dizziness, weakness and numbness.  Hematological: Does not bruise/bleed easily.  All other systems reviewed and are negative.    Allergies  Poison sumac extract  Home Medications   Current Outpatient Rx  Name Route Sig Dispense Refill  . DIPHENHYDRAMINE HCL 25 MG PO CAPS Oral Take 25 mg by mouth at bedtime.      Marland Kitchen EPINEPHRINE 0.3 MG/0.3ML IJ DEVI Intramuscular Inject 0.3 mg into the muscle once.      Marland Kitchen LEVOTHYROXINE SODIUM 100 MCG PO TABS Oral Take 100 mcg by mouth at bedtime.      Marland Kitchen LIDOCAINE VISCOUS 2 % MT SOLN  Swish and spit  Lidocaine just before eating tid for comfort. 100 mL 0  . LOSARTAN POTASSIUM 50 MG PO TABS Oral Take 50 mg by mouth at bedtime.      Marland Kitchen METFORMIN HCL ER 500 MG PO TB24 Oral Take 500 mg by mouth at bedtime.      Marland Kitchen METOPROLOL SUCCINATE ER 50 MG PO TB24 Oral Take 50 mg by mouth at bedtime.      Marland Kitchen PRAVASTATIN SODIUM 20 MG PO TABS Oral Take 20 mg by mouth at bedtime.      Marland Kitchen ZOLPIDEM TARTRATE 10 MG PO TABS Oral Take 10 mg by mouth at bedtime as needed.        BP 142/89  Pulse 73  Temp(Src) 97.6 F (36.4 C) (Oral)  Resp 18  Ht 5\' 4"  (1.626 m)  Wt 168 lb (76.204 kg)  BMI 28.84 kg/m2  SpO2 100%  Physical Exam  Nursing note and vitals reviewed. Constitutional: She is oriented to person, place, and time. She appears well-developed and well-nourished. No distress.  HENT:  Head: Normocephalic and atraumatic.  Mouth/Throat: Oropharynx is clear and moist.  Neck: Normal range of motion. Neck supple.  Cardiovascular: Normal rate, regular rhythm and normal heart sounds.   Pulmonary/Chest: Effort normal and breath sounds normal. No respiratory distress. She exhibits no tenderness.  Abdominal: Soft. She exhibits no distension. There is no tenderness.  Musculoskeletal: Normal range of motion. She exhibits tenderness. She exhibits no edema.       Left foot: She exhibits  tenderness. She exhibits normal range of motion, no bony tenderness, no swelling, normal capillary refill, no crepitus, no deformity and no laceration.       Several superficial abrasions to the posterior left foot and heel.  No edema or drainage  Lymphadenopathy:    She has no cervical adenopathy.  Neurological: She is alert and oriented to person, place, and time. No cranial nerve deficit. She exhibits normal muscle tone. Coordination normal.  Skin: Skin is warm and dry. No rash noted. There is erythema.    ED Course  Procedures (including critical care time)  Labs Reviewed - No data to display Dg Ankle Complete Left  07/09/2011  *RADIOLOGY REPORT*  Clinical Data: Pain, lower extremity swelling  LEFT ANKLE COMPLETE - 3+ VIEW  Comparison: None.  Findings: No fracture or dislocation is seen.  The ankle mortise is intact.  The base of the fifth metatarsal is unremarkable.  Plantar and posterior calcaneal enthesopathy.  Moderate medial soft tissue swelling.  IMPRESSION: No fracture or dislocation is seen.  Medial soft tissue swelling.  Original Report Authenticated By: Charline Bills, M.D.   Dg Foot Complete Left  07/09/2011  *RADIOLOGY REPORT*  Clinical Data: Lower extremity swelling/pain  LEFT FOOT - COMPLETE 3+ VIEW  Comparison: 05/04/2010  Findings: No fracture or dislocation is seen.  The joint spaces are preserved.  Plantar and posterior calcaneal enthesopathy.  Mild soft tissue swelling.  IMPRESSION: No fracture or dislocation is seen.  Mild soft tissue swelling.  Original Report Authenticated By: Charline Bills, M.D.         MDM    4:38 PM diffuse ttp of the dorsal left foot.  Abrasion to the heel with mild surrounding erythema.  No lymphangitis.  Will treat with pain medication and start antibiotics.           Ranbir Chew L. [place], [place] 07/12/11 2133

## 2011-07-09 NOTE — ED Notes (Signed)
C/o left foot pain x 2 days, swelling started this morning, denies any injury

## 2011-07-09 NOTE — ED Notes (Signed)
Pt a/ox4. Resp even and unlabored. NAD at this time. D/C instructions and rx reviewed with pt. Pt verbalized understanding. Pt ambulated to lobby with steady gate.  

## 2011-07-12 ENCOUNTER — Emergency Department (HOSPITAL_COMMUNITY)
Admission: EM | Admit: 2011-07-12 | Discharge: 2011-07-12 | Discharge status: Against Medical Advice | Payer: Medicare Other | Attending: Emergency Medicine | Admitting: Emergency Medicine

## 2011-07-12 ENCOUNTER — Encounter (HOSPITAL_COMMUNITY): Payer: Self-pay | Admitting: *Deleted

## 2011-07-12 DIAGNOSIS — Z0389 Encounter for observation for other suspected diseases and conditions ruled out: Secondary | ICD-10-CM | POA: Insufficient documentation

## 2011-07-12 NOTE — ED Notes (Signed)
Pt called 3 times to treatment room w/ no reply. Pt lwbs after triage.

## 2011-07-12 NOTE — ED Notes (Signed)
Pt has been called 3 times for room placement.

## 2011-07-12 NOTE — ED Notes (Signed)
Pt called once for room placement.

## 2011-07-12 NOTE — ED Notes (Signed)
Reports feeling like heart is beating fast, and intermittent chest pain, x2 days. Denies any nausea, or SOB. Denies pain radiates.

## 2011-07-15 NOTE — ED Provider Notes (Signed)
Evaluation and management procedures were performed by the PA/NP under my supervision/collaboration.    Jennier Schissler D Shray Hunley, MD 07/15/11 1102 

## 2011-10-19 ENCOUNTER — Ambulatory Visit: Payer: Self-pay | Admitting: Nurse Practitioner

## 2011-12-23 ENCOUNTER — Other Ambulatory Visit (HOSPITAL_COMMUNITY): Payer: Self-pay | Admitting: Nurse Practitioner

## 2012-01-03 ENCOUNTER — Ambulatory Visit: Payer: Self-pay | Admitting: Nurse Practitioner

## 2012-01-04 ENCOUNTER — Ambulatory Visit (HOSPITAL_COMMUNITY): Payer: Medicare Other

## 2012-10-05 ENCOUNTER — Encounter: Payer: Self-pay | Admitting: Family Medicine

## 2012-10-05 DIAGNOSIS — E039 Hypothyroidism, unspecified: Secondary | ICD-10-CM | POA: Insufficient documentation

## 2012-10-05 DIAGNOSIS — E785 Hyperlipidemia, unspecified: Secondary | ICD-10-CM | POA: Insufficient documentation

## 2012-10-05 DIAGNOSIS — G4700 Insomnia, unspecified: Secondary | ICD-10-CM | POA: Insufficient documentation

## 2012-10-05 DIAGNOSIS — I10 Essential (primary) hypertension: Secondary | ICD-10-CM | POA: Insufficient documentation

## 2012-11-12 ENCOUNTER — Ambulatory Visit: Payer: Self-pay | Admitting: Physician Assistant

## 2013-01-25 ENCOUNTER — Emergency Department (HOSPITAL_COMMUNITY)
Admission: EM | Admit: 2013-01-25 | Discharge: 2013-01-25 | Disposition: A | Discharge status: Home / Self Care | Payer: Medicare Other | Attending: Emergency Medicine | Admitting: Emergency Medicine

## 2013-01-25 ENCOUNTER — Emergency Department (HOSPITAL_COMMUNITY): Payer: Medicare Other

## 2013-01-25 ENCOUNTER — Encounter (HOSPITAL_COMMUNITY): Payer: Self-pay | Admitting: *Deleted

## 2013-01-25 DIAGNOSIS — E119 Type 2 diabetes mellitus without complications: Secondary | ICD-10-CM | POA: Insufficient documentation

## 2013-01-25 DIAGNOSIS — I10 Essential (primary) hypertension: Secondary | ICD-10-CM | POA: Insufficient documentation

## 2013-01-25 DIAGNOSIS — E785 Hyperlipidemia, unspecified: Secondary | ICD-10-CM | POA: Insufficient documentation

## 2013-01-25 DIAGNOSIS — Z794 Long term (current) use of insulin: Secondary | ICD-10-CM | POA: Insufficient documentation

## 2013-01-25 DIAGNOSIS — E039 Hypothyroidism, unspecified: Secondary | ICD-10-CM | POA: Insufficient documentation

## 2013-01-25 DIAGNOSIS — G4700 Insomnia, unspecified: Secondary | ICD-10-CM | POA: Insufficient documentation

## 2013-01-25 DIAGNOSIS — R079 Chest pain, unspecified: Secondary | ICD-10-CM

## 2013-01-25 DIAGNOSIS — Z79899 Other long term (current) drug therapy: Secondary | ICD-10-CM | POA: Insufficient documentation

## 2013-01-25 LAB — CBC WITH DIFFERENTIAL/PLATELET
Eosinophils Absolute: 0.2 10*3/uL (ref 0.0–0.7)
Eosinophils Relative: 2 % (ref 0–5)
Hemoglobin: 12.6 g/dL (ref 12.0–15.0)
Lymphocytes Relative: 34 % (ref 12–46)
Lymphs Abs: 2.9 10*3/uL (ref 0.7–4.0)
MCH: 30.2 pg (ref 26.0–34.0)
MCV: 83.9 fL (ref 78.0–100.0)
Monocytes Relative: 7 % (ref 3–12)
Neutrophils Relative %: 57 % (ref 43–77)
Platelets: 215 10*3/uL (ref 150–400)
RBC: 4.17 MIL/uL (ref 3.87–5.11)
WBC: 8.6 10*3/uL (ref 4.0–10.5)

## 2013-01-25 LAB — TROPONIN I: Troponin I: <0.3 ng/mL (ref <0.30)

## 2013-01-25 LAB — BASIC METABOLIC PANEL
CO2: 28 mEq/L (ref 19–32)
Calcium: 8.6 mg/dL (ref 8.4–10.5)
Glucose, Bld: 122 mg/dL — ABNORMAL HIGH (ref 70–99)
Potassium: 3.6 mEq/L (ref 3.5–5.1)
Sodium: 138 mEq/L (ref 135–145)

## 2013-01-25 MED ORDER — ALPRAZOLAM 0.5 MG PO TABS: 0.5000 mg | ORAL_TABLET | Freq: Every evening | ORAL | Status: DC | PRN

## 2013-01-25 MED ORDER — ASPIRIN 81 MG PO CHEW
324.0000 mg | CHEWABLE_TABLET | Freq: Once | ORAL | Status: AC
  Administered 2013-01-25: 324 mg via ORAL
  Filled 2013-01-25: qty 4

## 2013-01-25 NOTE — ED Provider Notes (Signed)
History    CSN: [id number] Arrival date & time 01/25/13  1757  First MD Initiated Contact with Patient 01/25/13 1805     Chief Complaint  Patient presents with  . Chest Pain   (Consider location/radiation/quality/duration/timing/severity/associated sxs/prior Treatment) HPI Comments: Pt has hx of Htn and DM, presents with R mid chest pain described as an intermittent sharp and stabbing feeling that seems to come on later in the day, sometimes when she is sitting or resting, sometimes with exertion, not assocaited with SOB, cough, fevers, swelling and has no rf for PE.  She does not smoke, no swelling in the legs, no trauma, travel, exogenous estrogen or hx of DVT / PE.  She has no hx of ACS either.  Does not take asa.  Pain doesn't radiate.  No prior card w/u.  Patient is a 57 y.o. female presenting with chest pain. The history is provided by the patient, a relative and medical records.  Chest Pain  Past Medical History  Diagnosis Date  . Diabetes mellitus   . Hypertension   . Hypothyroid   . Insomnia   . Hyperlipidemia    Past Surgical History  Procedure Laterality Date  . Thyroidectomy     Family History  Problem Relation Age of Onset  . Coronary artery disease Mother   . Coronary artery disease Father    History  Substance Use Topics  . Smoking status: Never Smoker   . Smokeless tobacco: Current User    Types: Snuff  . Alcohol Use: No   OB History   Grav Para Term Preterm Abortions TAB SAB Ect Mult Living                 Review of Systems  Cardiovascular: Positive for chest pain.  All other systems reviewed and are negative.    Allergies  Naproxen and Poison sumac extract  Home Medications   Current Outpatient Rx  Name  Route  Sig  Dispense  Refill  . diphenhydrAMINE (BENADRYL) 25 mg capsule   Oral   Take 25 mg by mouth at bedtime.           . insulin detemir (LEVEMIR) 100 UNIT/ML injection   Subcutaneous   Inject 15 Units into the skin at  bedtime.         Marland Kitchen levothyroxine (SYNTHROID, LEVOTHROID) 100 MCG tablet   Oral   Take 100 mcg by mouth at bedtime.           Marland Kitchen losartan (COZAAR) 50 MG tablet   Oral   Take 50 mg by mouth at bedtime.           . metFORMIN (GLUCOPHAGE) 1000 MG tablet   Oral   Take 1,000 mg by mouth 2 (two) times daily.         . metoprolol (TOPROL-XL) 50 MG 24 hr tablet   Oral   Take 50 mg by mouth at bedtime.           . pravastatin (PRAVACHOL) 20 MG tablet   Oral   Take 20 mg by mouth at bedtime.           . sitaGLIPtin (JANUVIA) 100 MG tablet   Oral   Take 100 mg by mouth at bedtime.          Marland Kitchen zolpidem (AMBIEN) 10 MG tablet   Oral   Take 10 mg by mouth at bedtime as needed. For sleep         . ALPRAZolam (  XANAX) 0.5 MG tablet   Oral   Take 1 tablet (0.5 mg total) by mouth at bedtime as needed for sleep.   5 tablet   0   . EPINEPHrine (EPIPEN) 0.3 mg/0.3 mL DEVI   Intramuscular   Inject 0.3 mg into the muscle once.            BP 162/94  Pulse 82  Temp(Src) 98 F (36.7 C) (Oral)  Resp 18  Ht 5\' 4"  (1.626 m)  Wt 168 lb (76.204 kg)  BMI 28.82 kg/m2  SpO2 99% Physical Exam  Nursing note and vitals reviewed. Constitutional: She appears well-developed and well-nourished. No distress.  HENT:  Head: Normocephalic and atraumatic.  Mouth/Throat: Oropharynx is clear and moist. No oropharyngeal exudate.  Eyes: Conjunctivae and EOM are normal. Pupils are equal, round, and reactive to light. Right eye exhibits no discharge. Left eye exhibits no discharge. No scleral icterus.  Neck: Normal range of motion. Neck supple. No JVD present. No thyromegaly present.  Cardiovascular: Normal rate, regular rhythm, normal heart sounds and intact distal pulses.  Exam reveals no gallop and no friction rub.   No murmur heard. Pulmonary/Chest: Effort normal and breath sounds normal. No respiratory distress. She has no wheezes. She has no rales.  Abdominal: Soft. Bowel sounds are  normal. She exhibits no distension and no mass. There is no tenderness.  Musculoskeletal: Normal range of motion. She exhibits no edema and no tenderness.  Lymphadenopathy:    She has no cervical adenopathy.  Neurological: She is alert. Coordination normal.  Skin: Skin is warm and dry. No rash noted. No erythema.  Psychiatric: She has a normal mood and affect. Her behavior is normal.    ED Course  Procedures (including critical care time) Labs Reviewed  BASIC METABOLIC PANEL - Abnormal; Notable for the following:    Glucose, Bld 122 (*)    All other components within normal limits  CBC WITH DIFFERENTIAL - Abnormal; Notable for the following:    HCT 35.0 (*)    All other components within normal limits  TROPONIN I   Dg Chest Port 1 View  01/25/2013   *RADIOLOGY REPORT*  Clinical Data: Chest pain, shortness of breath  PORTABLE CHEST - 1 VIEW  Comparison: 01/13/2011; 10/24/2010  Findings: Grossly unchanged borderline enlarged cardiac silhouette. Unchanged mediastinal contours.  There is grossly unchanged mild diffuse thickening of the interstitium.  Slight worsening of bibasilar heterogeneous opacities, left greater than right.  No definite pleural effusion or pneumothorax.  Unchanged bones.  IMPRESSION: Mild bronchitic change and bibasilar atelectasis suspected on this AP portable examination.  Further evaluation with a PA and lateral chest radiograph may be obtained as clinically indicated.   Original Report Authenticated By: Tacey Ruiz, MD   1. Chest pain     MDM  Well appearing, benign exam and ECG with non spec TWA and PRWP.  Needs eval for ACS, likely r/o given hx of rf and intermittent pain.  Not typical however.   ED ECG REPORT  I personally interpreted this EKG   Date: 01/25/2013   Rate: 74  Rhythm: normal sinus rhythm  QRS Axis: normal  Intervals: normal  ST/T Wave abnormalities: nonspecific T wave changes  Conduction Disutrbances:none  Narrative Interpretation:   Old  EKG Reviewed: c/w 01/13/11, no sig changes  Pt has normal trop, normal labs and normal CXR without acute findings to explain her sx.  She has been asx since arrival, has requested d/c - I spent > 10  minutes explaining to the pt and her friend that she need futher testing to exclude CAD and ACS and she refuses and wants to go home.  She has medical decision making capacity at this time - will return if sx worsen.  She requests meds for anxiety as she staets this is what she thinks is causing her sx.  I have very clearly told her that I want to admit her for further testing and she refuses.  Meds given in ED:  Medications  aspirin chewable tablet 324 mg (324 mg Oral Given 01/25/13 1824)    New Prescriptions   ALPRAZOLAM (XANAX) 0.5 MG TABLET    Take 1 tablet (0.5 mg total) by mouth at bedtime as needed for sleep.      Vida Roller, MD 01/25/13 [phone]

## 2013-01-25 NOTE — ED Notes (Signed)
Chest pain, without radiation for 3 days, no nv, alert, no sweats.

## 2013-01-25 NOTE — ED Notes (Addendum)
Pt resting comfortably.  No complaints at present time.  Tolerating p.o fluids with no difficulty.

## 2014-09-29 ENCOUNTER — Ambulatory Visit: Payer: Self-pay | Admitting: Unknown Physician Specialty

## 2014-09-29 DIAGNOSIS — T783XXA Angioneurotic edema, initial encounter: Secondary | ICD-10-CM | POA: Insufficient documentation

## 2014-09-29 DIAGNOSIS — E890 Postprocedural hypothyroidism: Secondary | ICD-10-CM | POA: Insufficient documentation

## 2014-09-29 DIAGNOSIS — E139 Other specified diabetes mellitus without complications: Secondary | ICD-10-CM | POA: Insufficient documentation

## 2015-05-17 ENCOUNTER — Encounter (HOSPITAL_COMMUNITY): Payer: Self-pay | Admitting: Emergency Medicine

## 2015-05-17 ENCOUNTER — Emergency Department (HOSPITAL_COMMUNITY)
Admission: EM | Admit: 2015-05-17 | Discharge: 2015-05-17 | Discharge status: Against Medical Advice | Payer: Medicare Other | Attending: Emergency Medicine | Admitting: Emergency Medicine

## 2015-05-17 DIAGNOSIS — I10 Essential (primary) hypertension: Secondary | ICD-10-CM | POA: Diagnosis not present

## 2015-05-17 DIAGNOSIS — R220 Localized swelling, mass and lump, head: Secondary | ICD-10-CM | POA: Insufficient documentation

## 2015-05-17 DIAGNOSIS — E119 Type 2 diabetes mellitus without complications: Secondary | ICD-10-CM | POA: Insufficient documentation

## 2015-05-17 NOTE — ED Notes (Signed)
Pt requesting to leave states her ride is here; pt is encouraged to return if she feels any worse.

## 2015-05-17 NOTE — ED Notes (Signed)
Pt states she had a bath and 5-10 minutes after she got out she started noticing that her lips and eyes were swelling. Denies difficulty breathing. States she hasn't used anything new on her body.

## 2015-05-22 ENCOUNTER — Encounter (HOSPITAL_COMMUNITY): Payer: Self-pay | Admitting: Emergency Medicine

## 2015-05-22 ENCOUNTER — Emergency Department (HOSPITAL_COMMUNITY)
Admission: EM | Admit: 2015-05-22 | Discharge: 2015-05-22 | Discharge status: Against Medical Advice | Payer: Medicare Other | Attending: Emergency Medicine | Admitting: Emergency Medicine

## 2015-05-22 DIAGNOSIS — I10 Essential (primary) hypertension: Secondary | ICD-10-CM | POA: Diagnosis not present

## 2015-05-22 DIAGNOSIS — E119 Type 2 diabetes mellitus without complications: Secondary | ICD-10-CM | POA: Insufficient documentation

## 2015-05-22 DIAGNOSIS — R220 Localized swelling, mass and lump, head: Secondary | ICD-10-CM | POA: Insufficient documentation

## 2015-05-22 NOTE — ED Notes (Signed)
Pt state she feeling like throat, eyes and lips are swelling. Pt has had allergy work up at [facility].  Still does not know what she is allergy to. Did not use epic pen tonight.

## 2015-05-22 NOTE — ED Notes (Signed)
Unable to locate patient in all waiting areas

## 2015-05-22 NOTE — ED Notes (Signed)
Unable to locate pt in waiting areas.  Per registration clerk, pt left

## 2015-05-22 NOTE — ED Notes (Signed)
Pt feeling better took 3 Benadryl

## 2016-10-16 ENCOUNTER — Inpatient Hospital Stay
Admission: EM | Admit: 2016-10-16 | Discharge: 2016-10-18 | DRG: 418 | Disposition: A | Discharge status: Home / Self Care | Payer: Medicare Other | Attending: Surgery | Admitting: Surgery

## 2016-10-16 ENCOUNTER — Emergency Department: Payer: Medicare Other

## 2016-10-16 ENCOUNTER — Encounter: Payer: Self-pay | Admitting: Emergency Medicine

## 2016-10-16 DIAGNOSIS — Z720 Tobacco use: Secondary | ICD-10-CM

## 2016-10-16 DIAGNOSIS — Z972 Presence of dental prosthetic device (complete) (partial): Secondary | ICD-10-CM

## 2016-10-16 DIAGNOSIS — N179 Acute kidney failure, unspecified: Secondary | ICD-10-CM | POA: Diagnosis present

## 2016-10-16 DIAGNOSIS — G4700 Insomnia, unspecified: Secondary | ICD-10-CM | POA: Diagnosis present

## 2016-10-16 DIAGNOSIS — Z886 Allergy status to analgesic agent status: Secondary | ICD-10-CM

## 2016-10-16 DIAGNOSIS — Z79899 Other long term (current) drug therapy: Secondary | ICD-10-CM

## 2016-10-16 DIAGNOSIS — Z8249 Family history of ischemic heart disease and other diseases of the circulatory system: Secondary | ICD-10-CM

## 2016-10-16 DIAGNOSIS — Z794 Long term (current) use of insulin: Secondary | ICD-10-CM

## 2016-10-16 DIAGNOSIS — K810 Acute cholecystitis: Principal | ICD-10-CM | POA: Diagnosis present

## 2016-10-16 DIAGNOSIS — E039 Hypothyroidism, unspecified: Secondary | ICD-10-CM | POA: Diagnosis present

## 2016-10-16 DIAGNOSIS — K219 Gastro-esophageal reflux disease without esophagitis: Secondary | ICD-10-CM | POA: Diagnosis present

## 2016-10-16 DIAGNOSIS — N390 Urinary tract infection, site not specified: Secondary | ICD-10-CM

## 2016-10-16 DIAGNOSIS — N289 Disorder of kidney and ureter, unspecified: Secondary | ICD-10-CM

## 2016-10-16 DIAGNOSIS — Z91048 Other nonmedicinal substance allergy status: Secondary | ICD-10-CM

## 2016-10-16 DIAGNOSIS — E119 Type 2 diabetes mellitus without complications: Secondary | ICD-10-CM | POA: Diagnosis present

## 2016-10-16 DIAGNOSIS — E785 Hyperlipidemia, unspecified: Secondary | ICD-10-CM | POA: Diagnosis present

## 2016-10-16 DIAGNOSIS — I10 Essential (primary) hypertension: Secondary | ICD-10-CM | POA: Diagnosis present

## 2016-10-16 LAB — URINALYSIS, ROUTINE W REFLEX MICROSCOPIC
Bacteria, UA: NONE SEEN
Bilirubin Urine: NEGATIVE
Glucose, UA: 50 mg/dL — AB
HGB URINE DIPSTICK: NEGATIVE
Ketones, ur: NEGATIVE mg/dL
Nitrite: NEGATIVE
PROTEIN: 100 mg/dL — AB
Specific Gravity, Urine: 1.018 (ref 1.005–1.030)
pH: 5 (ref 5.0–8.0)

## 2016-10-16 LAB — CBC
HCT: 35.3 % (ref 35.0–47.0)
Hemoglobin: 12.3 g/dL (ref 12.0–16.0)
MCH: 30 pg (ref 26.0–34.0)
MCHC: 34.8 g/dL (ref 32.0–36.0)
MCV: 86.4 fL (ref 80.0–100.0)
PLATELETS: 197 10*3/uL (ref 150–440)
RBC: 4.09 MIL/uL (ref 3.80–5.20)
RDW: 13.5 % (ref 11.5–14.5)
WBC: 19.1 10*3/uL — ABNORMAL HIGH (ref 3.6–11.0)

## 2016-10-16 LAB — BASIC METABOLIC PANEL
Anion gap: 9 (ref 5–15)
BUN: 18 mg/dL (ref 6–20)
CHLORIDE: 95 mmol/L — AB (ref 101–111)
CO2: 27 mmol/L (ref 22–32)
CREATININE: 1.54 mg/dL — AB (ref 0.44–1.00)
Calcium: 7.8 mg/dL — ABNORMAL LOW (ref 8.9–10.3)
GFR calc Af Amer: 42 mL/min — ABNORMAL LOW (ref >60)
GFR calc non Af Amer: 36 mL/min — ABNORMAL LOW (ref >60)
Glucose, Bld: 280 mg/dL — ABNORMAL HIGH (ref 65–99)
Potassium: 3.8 mmol/L (ref 3.5–5.1)
Sodium: 131 mmol/L — ABNORMAL LOW (ref 135–145)

## 2016-10-16 LAB — HEPATIC FUNCTION PANEL
ALT: 15 U/L (ref 14–54)
AST: 17 U/L (ref 15–41)
Albumin: 3.3 g/dL — ABNORMAL LOW (ref 3.5–5.0)
Alkaline Phosphatase: 33 U/L — ABNORMAL LOW (ref 38–126)
BILIRUBIN INDIRECT: 0.5 mg/dL (ref 0.3–0.9)
Bilirubin, Direct: 0.3 mg/dL (ref 0.1–0.5)
TOTAL PROTEIN: 7.2 g/dL (ref 6.5–8.1)
Total Bilirubin: 0.8 mg/dL (ref 0.3–1.2)

## 2016-10-16 LAB — LIPASE, BLOOD: Lipase: <10 U/L — ABNORMAL LOW (ref 11–51)

## 2016-10-16 MED ORDER — SODIUM CHLORIDE 0.9 % IV SOLN
Freq: Once | INTRAVENOUS | Status: AC
  Administered 2016-10-17: via INTRAVENOUS

## 2016-10-16 MED ORDER — METRONIDAZOLE IN NACL 5-0.79 MG/ML-% IV SOLN
500.0000 mg | Freq: Once | INTRAVENOUS | Status: AC
  Administered 2016-10-17: 500 mg via INTRAVENOUS
  Filled 2016-10-16: qty 100

## 2016-10-16 MED ORDER — SODIUM CHLORIDE 0.9 % IV BOLUS (SEPSIS)
1000.0000 mL | INTRAVENOUS | Status: AC
  Administered 2016-10-16: 1000 mL via INTRAVENOUS

## 2016-10-16 MED ORDER — ONDANSETRON HCL 4 MG/2ML IJ SOLN
4.0000 mg | INTRAMUSCULAR | Status: AC
  Administered 2016-10-16: 4 mg via INTRAVENOUS
  Filled 2016-10-16: qty 2

## 2016-10-16 MED ORDER — DEXTROSE 5 % IV SOLN
1.0000 g | INTRAVENOUS | Status: AC
  Administered 2016-10-16: 1 g via INTRAVENOUS
  Filled 2016-10-16: qty 10

## 2016-10-16 MED ORDER — MORPHINE SULFATE (PF) 4 MG/ML IV SOLN
4.0000 mg | Freq: Once | INTRAVENOUS | Status: AC
  Administered 2016-10-16: 4 mg via INTRAVENOUS
  Filled 2016-10-16: qty 1

## 2016-10-16 NOTE — ED Provider Notes (Signed)
[facility] Emergency Department Provider Note  ____________________________________________   First MD Initiated Contact with Patient 10/16/16 2056     (approximate)  I have reviewed the triage vital signs and the nursing notes.   HISTORY  Chief Complaint Back Pain; Abdominal Pain; and Urinary Frequency    HPI Sherry Fields is a 61 y.o. female who has a past medical history as listed below who presents for evaluation of worsening right flank pain and right-sided lower abdominal pain that has been present for about three days. It has been gradual in onset and is now severe. It is been accompanied with nausea and vomiting today. She denies dysuria, fever/chills, chest pain, shortness of breath, diarrhea. She has had a urinary tract infection in the past. She has been taking azo but nothing is helping her feel better. She does not notice that her symptoms are better or worse after eating anything. She is not seen any blood in her urine and has no history of kidney stones.   Past Medical History:  Diagnosis Date  . Diabetes mellitus   . Hyperlipidemia   . Hypertension   . Hypothyroid   . Insomnia     Patient Active Problem List   Diagnosis Date Noted  . Acute cholecystitis 10/17/2016  . Acute renal insufficiency   . Diabetes mellitus   . Hypertension   . Hypothyroid   . Insomnia   . Hyperlipidemia     Past Surgical History:  Procedure Laterality Date  . THYROIDECTOMY      Prior to Admission medications   Medication Sig Start Date End Date Taking? Authorizing Provider  diphenhydrAMINE (BENADRYL) 25 mg capsule Take 25 mg by mouth at bedtime.     Yes Historical Provider, MD  EPINEPHrine (EPIPEN) 0.3 mg/0.3 mL DEVI Inject 0.3 mg into the muscle once.     Yes Historical Provider, MD  insulin detemir (LEVEMIR) 100 UNIT/ML injection Inject 15 Units into the skin at bedtime.   Yes Historical Provider, MD  levothyroxine (SYNTHROID, LEVOTHROID) 100 MCG  tablet Take 100 mcg by mouth at bedtime.     Yes Historical Provider, MD  losartan (COZAAR) 50 MG tablet Take 50 mg by mouth at bedtime.     Yes Historical Provider, MD  metFORMIN (GLUCOPHAGE) 1000 MG tablet Take 1,000 mg by mouth 2 (two) times daily.   Yes Historical Provider, MD  metoprolol (TOPROL-XL) 50 MG 24 hr tablet Take 50 mg by mouth at bedtime.     Yes Historical Provider, MD  pravastatin (PRAVACHOL) 20 MG tablet Take 20 mg by mouth at bedtime.     Yes Historical Provider, MD  sitaGLIPtin (JANUVIA) 100 MG tablet Take 100 mg by mouth at bedtime.    Yes Historical Provider, MD  zolpidem (AMBIEN) 10 MG tablet Take 10 mg by mouth at bedtime as needed. For sleep   Yes Historical Provider, MD  ALPRAZolam (XANAX) 0.5 MG tablet Take 1 tablet (0.5 mg total) by mouth at bedtime as needed for sleep. Patient not taking: Reported on 10/17/2016 01/25/13   Noemi Chapel, MD    Allergies Naproxen and Poison sumac extract  Family History  Problem Relation Age of Onset  . Coronary artery disease Mother   . Coronary artery disease Father     Social History Social History  Substance Use Topics  . Smoking status: Never Smoker  . Smokeless tobacco: Current User    Types: Snuff  . Alcohol use No    Review of Systems  Constitutional: No fever/chills Eyes: No visual changes. ENT: No sore throat. Cardiovascular: Denies chest pain. Respiratory: Denies shortness of breath. Gastrointestinal: right-sided flank pain radiating to right lower quadrant. Several episodes of nausea and vomiting Genitourinary: Negative for dysuria. Musculoskeletal: Negative for back pain. Skin: Negative for rash. Neurological: Negative for headaches, focal weakness or numbness.  10-point ROS otherwise negative.  ____________________________________________   PHYSICAL EXAM:  VITAL SIGNS: ED Triage Vitals  Enc Vitals Group     BP 10/16/16 1808 122/66     Pulse Rate 10/16/16 1808 93     Resp 10/16/16 1808 (!) 2      Temp 10/16/16 1808 99.1 F (37.3 C)     Temp Source 10/16/16 1808 Oral     SpO2 10/16/16 1808 97 %     Weight 10/16/16 1809 168 lb (76.2 kg)     Height 10/16/16 1809 5\' 3"  (1.6 m)     Head Circumference --      Peak Flow --      Pain Score 10/16/16 1809 9     Pain Loc --      Pain Edu? --      Excl. in [place]? --     Constitutional: Alert and oriented. generally well-appearing but does appear uncomfortable Eyes: Conjunctivae are normal. PERRL. EOMI. Head: Atraumatic. Nose: No congestion/rhinnorhea. Mouth/Throat: Mucous membranes are moist. Neck: No stridor.  No meningeal signs.   Cardiovascular: Normal rate, regular rhythm. Good peripheral circulation. Grossly normal heart sounds. Respiratory: Normal respiratory effort.  No retractions. Lungs CTAB. Gastrointestinal: Soft with moderate diffuse tenderness to palpation throughout the right side of the abdomen and positive Murphy sign. No right lower quadrant tenderness. No rebound or guarding. No CVA tenderness on either side Musculoskeletal: No lower extremity tenderness nor edema. No gross deformities of extremities. Neurologic:  Normal speech and language. No gross focal neurologic deficits are appreciated.  Skin:  Skin is warm, dry and intact. No rash noted. Psychiatric: Mood and affect are normal. Speech and behavior are normal.  ____________________________________________   LABS (all labs ordered are listed, but only abnormal results are displayed)  Labs Reviewed  BASIC METABOLIC PANEL - Abnormal; Notable for the following:       Result Value   Sodium 131 (*)    Chloride 95 (*)    Glucose, Bld 280 (*)    Creatinine, Ser 1.54 (*)    Calcium 7.8 (*)    GFR calc non Af Amer 36 (*)    GFR calc Af Amer 42 (*)    All other components within normal limits  CBC - Abnormal; Notable for the following:    WBC 19.1 (*)    All other components within normal limits  URINALYSIS, ROUTINE W REFLEX MICROSCOPIC - Abnormal; Notable for  the following:    Color, Urine AMBER (*)    APPearance CLOUDY (*)    Glucose, UA 50 (*)    Protein, ur 100 (*)    Leukocytes, UA LARGE (*)    Squamous Epithelial / LPF 0-5 (*)    Non Squamous Epithelial 0-5 (*)    All other components within normal limits  HEPATIC FUNCTION PANEL - Abnormal; Notable for the following:    Albumin 3.3 (*)    Alkaline Phosphatase 33 (*)    All other components within normal limits  LIPASE, BLOOD - Abnormal; Notable for the following:    Lipase <10 (*)    All other components within normal limits  URINE CULTURE  BASIC METABOLIC  PANEL  CBC  HIV ANTIBODY (ROUTINE TESTING)   ____________________________________________  EKG  no EKG ordered by ED provider ____________________________________________  RADIOLOGY   Ct Renal Stone Study  Result Date: 10/16/2016 CLINICAL DATA:  Right flank pain for 2 days. UTI and possible pyelonephritis. EXAM: CT ABDOMEN AND PELVIS WITHOUT CONTRAST TECHNIQUE: Multidetector CT imaging of the abdomen and pelvis was performed following the standard protocol without IV contrast. COMPARISON:  03/02/2010 FINDINGS: Lower chest: Subsegmental atelectasis in the lung bases. No pleural effusion. Normal heart size. Hepatobiliary: Punctate calcification in the liver. Hydropic, thick-walled gallbladder containing small stones and/or debris and with moderate pericholecystic inflammatory stranding. No biliary dilatation. Pancreas: Unremarkable. Spleen: Unremarkable. Adrenals/Urinary Tract: Unremarkable adrenal glands. No evidence of renal mass, hydronephrosis, or urinary tract calculi. Unremarkable bladder. Stomach/Bowel: The stomach is within normal limits. There is no evidence of bowel obstruction. There is descending and sigmoid colon diverticulosis without evidence of diverticulitis. The appendix is unremarkable. Vascular/Lymphatic: Mild abdominal aortic atherosclerosis without aneurysm. Numerous small retroperitoneal lymph nodes measuring  up to 6 mm in size, stable to minimally increased from the prior study and likely benign. Reproductive: Interval calcification of the previously seen uterine fibroid. Unchanged appearance of the left ovary without adnexal mass. Poor delineation of the right ovary. Other: No intraperitoneal free fluid. No abdominal wall mass or hernia. Musculoskeletal: No acute osseous abnormality or suspicious osseous lesion. IMPRESSION: 1. Hydropic, inflamed gallbladder consistent with acute cholecystitis. 2. No evidence of urinary tract calculi or obstruction. 3. Uterine fibroid. 4. Colonic diverticulosis. 5. Aortic atherosclerosis. Electronically Signed   By: Logan Bores M.D.   On: 10/16/2016 21:33   US Abdomen Limited Ruq  Result Date: 10/16/2016 CLINICAL DATA:  Right flank pain for 2 days. Acute cholecystitis demonstrated at CT. EXAM: US ABDOMEN LIMITED - RIGHT UPPER QUADRANT COMPARISON:  CT abdomen and pelvis 10/16/2016 FINDINGS: Gallbladder: Gallbladder is distended with small stones and sludge layering in the gallbladder. Diffuse wall thickening and edema, measuring up to 10 mm. Murphy's sign is positive. Common bile duct: Diameter: 4.6 mm, normal Liver: No focal lesion identified. Within normal limits in parenchymal echogenicity. IMPRESSION: Gallbladder distention with wall thickening, pericholecystic edema, stones, and sludge. Positive Murphy's sign. Changes are consistent with acute cholecystitis in the appropriate clinical setting. Electronically Signed   By: Lucienne Capers M.D.   On: 10/16/2016 23:24    ____________________________________________   PROCEDURES  Procedure(s) performed:   Procedures   Critical Care performed: No ____________________________________________   INITIAL IMPRESSION / ASSESSMENT AND PLAN / ED COURSE  Pertinent labs & imaging results that were available during my care of the patient were reviewed by me and considered in my medical decision making (see chart for  details).  the patient has what appears to be a grossly infected urine with a leukocytosis of 19. I suspect pyelonephritis. Given the significant morbidity and mortality associated with a urinary tract infection and a infected/impacted kidney stone, however, I will obtain a CT renal stone protocol to look for signs of stones which should also help look for signs of pyelonephritis. I am giving a dose of morphine and Zofran in 1 L of IV fluids for her acute renal insufficiency likely related to decreased volume intake and her recent vomiting.   Clinical Course as of Oct 17 128  Sun Oct 16, 2016  2146 The patient has what appears to be cholecystitis on her CT scan.  A basic metabolic panel was ordered in triage rather than a CMP so I added on  hepatic function panel and lipase and I have ordered an ultrasound of her right upper quadrant.  I will update the patient is examined opportunity but there are multiple critically ill patients in the ED at this time.  I will proceed with her evaluation.  [CF]  2337 Spoke by phone with Dr. Burt Knack who is currently operating.  He will come to the ED to see the patient for probable admission.  Updated patient and family regarding the plan, stressed the need to stay NPO.  Adding Flagyl to the antibiotic regimen (after ceftriaxone 1 g IV) and starting more fluids.   [CF]    Clinical Course User Index [CF] Hinda Kehr, MD    (Note that documentation was delayed due to multiple ED patients requiring immediate care.) Dr. Burt Knack came to the ED and evaluted the patient in person and will admit  ____________________________________________  FINAL CLINICAL IMPRESSION(S) / ED DIAGNOSES  Final diagnoses:  Acute cholecystitis  Urinary tract infection without hematuria, site unspecified  Acute renal insufficiency     MEDICATIONS GIVEN DURING THIS VISIT:  Medications  losartan (COZAAR) tablet 50 mg (not administered)  metoprolol succinate (TOPROL-XL) 24 hr tablet  50 mg (not administered)  pravastatin (PRAVACHOL) tablet 20 mg (not administered)  heparin injection 5,000 Units (not administered)  lactated ringers infusion (not administered)  ondansetron (ZOFRAN) tablet 4 mg (not administered)    Or  ondansetron (ZOFRAN) injection 4 mg (not administered)  Ampicillin-Sulbactam (UNASYN) 3 g in sodium chloride 0.9 % 100 mL IVPB (not administered)  insulin aspart (novoLOG) injection 0-15 Units (not administered)  cefTRIAXone (ROCEPHIN) 1 g in dextrose 5 % 50 mL IVPB (0 g Intravenous Stopped 10/16/16 2102)  sodium chloride 0.9 % bolus 1,000 mL (0 mLs Intravenous Stopped 10/16/16 2311)  morphine 4 MG/ML injection 4 mg (4 mg Intravenous Given 10/16/16 2102)  ondansetron (ZOFRAN) injection 4 mg (4 mg Intravenous Given 10/16/16 2102)  metroNIDAZOLE (FLAGYL) IVPB 500 mg (500 mg Intravenous Transfusing/Transfer 10/17/16 0059)  0.9 %  sodium chloride infusion ( Intravenous Transfusing/Transfer 10/17/16 0059)     NEW OUTPATIENT MEDICATIONS STARTED DURING THIS VISIT:  Current Discharge Medication List      Current Discharge Medication List      Current Discharge Medication List       Note:  This document was prepared using Dragon voice recognition software and may include unintentional dictation errors.    Hinda Kehr, MD 10/17/16 0130

## 2016-10-16 NOTE — ED Notes (Signed)
Pt states R sided belly pain in RLQ and R sided back pain since Friday. Denies urinary frequency or pain with urination to this RN. Denies hx of kidney stones, states "I had a UTI a while back." Pt states N&V. Twice yesterday and once today. Pt alert, oriented, family at bedside.

## 2016-10-16 NOTE — ED Notes (Signed)
When asked it when pt last ate was she stated 2 days ago. Then she stated she had not eaten since Thursday. Pt upset about not being able to have water at this time.

## 2016-10-16 NOTE — ED Triage Notes (Signed)
Pt reports she developed some lower back pain and RLQ pain reports some urinary urgency and frequency has been taking AZO for feeling of pressure. Pt reports she developed some nausea and vomited last night. Pt denies any vomiting today reports just nausea, pain to RLQ radiates to lower back per pt. Denies any other symptom, pt talks in complete sentences

## 2016-10-16 NOTE — ED Notes (Signed)
Pt is in [place] via stretcher

## 2016-10-16 NOTE — ED Notes (Signed)
Pt up to restroom to void.  

## 2016-10-17 ENCOUNTER — Encounter
Admission: EM | Disposition: A | Discharge status: Home / Self Care | Payer: Self-pay | Source: Home / Self Care | Attending: Surgery

## 2016-10-17 ENCOUNTER — Encounter: Payer: Self-pay | Admitting: Anesthesiology

## 2016-10-17 ENCOUNTER — Inpatient Hospital Stay: Payer: Medicare Other | Admitting: Certified Registered"

## 2016-10-17 DIAGNOSIS — Z972 Presence of dental prosthetic device (complete) (partial): Secondary | ICD-10-CM | POA: Diagnosis not present

## 2016-10-17 DIAGNOSIS — Z886 Allergy status to analgesic agent status: Secondary | ICD-10-CM | POA: Diagnosis not present

## 2016-10-17 DIAGNOSIS — E119 Type 2 diabetes mellitus without complications: Secondary | ICD-10-CM | POA: Diagnosis present

## 2016-10-17 DIAGNOSIS — N179 Acute kidney failure, unspecified: Secondary | ICD-10-CM | POA: Diagnosis present

## 2016-10-17 DIAGNOSIS — K810 Acute cholecystitis: Secondary | ICD-10-CM | POA: Diagnosis present

## 2016-10-17 DIAGNOSIS — N390 Urinary tract infection, site not specified: Secondary | ICD-10-CM | POA: Diagnosis present

## 2016-10-17 DIAGNOSIS — E1169 Type 2 diabetes mellitus with other specified complication: Secondary | ICD-10-CM

## 2016-10-17 DIAGNOSIS — E785 Hyperlipidemia, unspecified: Secondary | ICD-10-CM | POA: Diagnosis present

## 2016-10-17 DIAGNOSIS — Z794 Long term (current) use of insulin: Secondary | ICD-10-CM | POA: Diagnosis not present

## 2016-10-17 DIAGNOSIS — N289 Disorder of kidney and ureter, unspecified: Secondary | ICD-10-CM | POA: Insufficient documentation

## 2016-10-17 DIAGNOSIS — K219 Gastro-esophageal reflux disease without esophagitis: Secondary | ICD-10-CM | POA: Diagnosis present

## 2016-10-17 DIAGNOSIS — I10 Essential (primary) hypertension: Secondary | ICD-10-CM | POA: Diagnosis present

## 2016-10-17 DIAGNOSIS — Z720 Tobacco use: Secondary | ICD-10-CM | POA: Diagnosis not present

## 2016-10-17 DIAGNOSIS — Z8249 Family history of ischemic heart disease and other diseases of the circulatory system: Secondary | ICD-10-CM | POA: Diagnosis not present

## 2016-10-17 DIAGNOSIS — G4700 Insomnia, unspecified: Secondary | ICD-10-CM | POA: Diagnosis present

## 2016-10-17 DIAGNOSIS — E039 Hypothyroidism, unspecified: Secondary | ICD-10-CM | POA: Diagnosis present

## 2016-10-17 DIAGNOSIS — Z91048 Other nonmedicinal substance allergy status: Secondary | ICD-10-CM | POA: Diagnosis not present

## 2016-10-17 DIAGNOSIS — Z79899 Other long term (current) drug therapy: Secondary | ICD-10-CM | POA: Diagnosis not present

## 2016-10-17 HISTORY — PX: CHOLECYSTECTOMY: SHX55

## 2016-10-17 LAB — BASIC METABOLIC PANEL
ANION GAP: 7 (ref 5–15)
BUN: 11 mg/dL (ref 6–20)
CHLORIDE: 102 mmol/L (ref 101–111)
CO2: 25 mmol/L (ref 22–32)
Calcium: 7.4 mg/dL — ABNORMAL LOW (ref 8.9–10.3)
Creatinine, Ser: 0.95 mg/dL (ref 0.44–1.00)
GFR calc non Af Amer: >60 mL/min (ref >60)
Glucose, Bld: 201 mg/dL — ABNORMAL HIGH (ref 65–99)
Potassium: 3.6 mmol/L (ref 3.5–5.1)
SODIUM: 134 mmol/L — AB (ref 135–145)

## 2016-10-17 LAB — CBC
HCT: 32.8 % — ABNORMAL LOW (ref 35.0–47.0)
HEMOGLOBIN: 11.6 g/dL — AB (ref 12.0–16.0)
MCH: 30.1 pg (ref 26.0–34.0)
MCHC: 35.2 g/dL (ref 32.0–36.0)
MCV: 85.4 fL (ref 80.0–100.0)
PLATELETS: 177 10*3/uL (ref 150–440)
RBC: 3.84 MIL/uL (ref 3.80–5.20)
RDW: 13.3 % (ref 11.5–14.5)
WBC: 14.1 10*3/uL — AB (ref 3.6–11.0)

## 2016-10-17 LAB — MRSA PCR SCREENING: MRSA BY PCR: NEGATIVE

## 2016-10-17 LAB — GLUCOSE, CAPILLARY
GLUCOSE-CAPILLARY: 220 mg/dL — AB (ref 65–99)
Glucose-Capillary: 165 mg/dL — ABNORMAL HIGH (ref 65–99)
Glucose-Capillary: 143 mg/dL — ABNORMAL HIGH (ref 65–99)

## 2016-10-17 LAB — HEPATIC FUNCTION PANEL
ALBUMIN: 2.8 g/dL — AB (ref 3.5–5.0)
ALT: 12 U/L — AB (ref 14–54)
AST: 15 U/L (ref 15–41)
Alkaline Phosphatase: 32 U/L — ABNORMAL LOW (ref 38–126)
BILIRUBIN DIRECT: 0.3 mg/dL (ref 0.1–0.5)
BILIRUBIN TOTAL: 0.8 mg/dL (ref 0.3–1.2)
Indirect Bilirubin: 0.5 mg/dL (ref 0.3–0.9)
Total Protein: 5.9 g/dL — ABNORMAL LOW (ref 6.5–8.1)

## 2016-10-17 SURGERY — LAPAROSCOPIC CHOLECYSTECTOMY
Anesthesia: General | Site: Abdomen | Wound class: Clean Contaminated

## 2016-10-17 MED ORDER — PRAVASTATIN SODIUM 20 MG PO TABS
20.0000 mg | ORAL_TABLET | Freq: Every day | ORAL | Status: DC
  Administered 2016-10-17 (×2): 20 mg via ORAL
  Filled 2016-10-17 (×2): qty 1

## 2016-10-17 MED ORDER — MIDAZOLAM HCL 2 MG/2ML IJ SOLN
INTRAMUSCULAR | Status: AC
  Filled 2016-10-17: qty 2

## 2016-10-17 MED ORDER — SUGAMMADEX SODIUM 200 MG/2ML IV SOLN
INTRAVENOUS | Status: DC | PRN
  Administered 2016-10-17: 150.6 mg via INTRAVENOUS

## 2016-10-17 MED ORDER — BUPIVACAINE-EPINEPHRINE 0.25% -1:200000 IJ SOLN
INTRAMUSCULAR | Status: DC | PRN
  Administered 2016-10-17: 30 mL

## 2016-10-17 MED ORDER — FENTANYL CITRATE (PF) 100 MCG/2ML IJ SOLN
25.0000 ug | INTRAMUSCULAR | Status: DC | PRN
  Administered 2016-10-17 (×4): 25 ug via INTRAVENOUS

## 2016-10-17 MED ORDER — OXYCODONE-ACETAMINOPHEN 7.5-325 MG PO TABS
2.0000 | ORAL_TABLET | ORAL | Status: DC | PRN
  Administered 2016-10-17: 2 via ORAL
  Filled 2016-10-17 (×2): qty 1

## 2016-10-17 MED ORDER — LIDOCAINE HCL (PF) 2 % IJ SOLN
INTRAMUSCULAR | Status: AC
  Filled 2016-10-17: qty 2

## 2016-10-17 MED ORDER — ONDANSETRON HCL 4 MG/2ML IJ SOLN: 4.0000 mg | Freq: Four times a day (QID) | INTRAMUSCULAR | Status: DC | PRN

## 2016-10-17 MED ORDER — ROCURONIUM BROMIDE 100 MG/10ML IV SOLN
INTRAVENOUS | Status: DC | PRN
  Administered 2016-10-17: 40 mg via INTRAVENOUS

## 2016-10-17 MED ORDER — INSULIN ASPART 100 UNIT/ML ~~LOC~~ SOLN
0.0000 [IU] | Freq: Four times a day (QID) | SUBCUTANEOUS | Status: DC
  Administered 2016-10-17: 5 [IU] via SUBCUTANEOUS
  Administered 2016-10-17: 3 [IU] via SUBCUTANEOUS
  Administered 2016-10-18 (×2): 8 [IU] via SUBCUTANEOUS
  Filled 2016-10-17 (×2): qty 8
  Filled 2016-10-17: qty 3
  Filled 2016-10-17: qty 5

## 2016-10-17 MED ORDER — PROPOFOL 10 MG/ML IV BOLUS
INTRAVENOUS | Status: DC | PRN
  Administered 2016-10-17: 30 mg via INTRAVENOUS
  Administered 2016-10-17: 150 mg via INTRAVENOUS

## 2016-10-17 MED ORDER — LOSARTAN POTASSIUM 50 MG PO TABS
50.0000 mg | ORAL_TABLET | Freq: Every day | ORAL | Status: DC
  Administered 2016-10-17 (×2): 50 mg via ORAL
  Filled 2016-10-17 (×2): qty 1

## 2016-10-17 MED ORDER — MIDAZOLAM HCL 2 MG/2ML IJ SOLN
INTRAMUSCULAR | Status: DC | PRN
  Administered 2016-10-17: 2 mg via INTRAVENOUS

## 2016-10-17 MED ORDER — INSULIN ASPART 100 UNIT/ML ~~LOC~~ SOLN: 0.0000 [IU] | Freq: Four times a day (QID) | SUBCUTANEOUS | Status: DC

## 2016-10-17 MED ORDER — HEPARIN SODIUM (PORCINE) 5000 UNIT/ML IJ SOLN
5000.0000 [IU] | Freq: Three times a day (TID) | INTRAMUSCULAR | Status: DC
  Administered 2016-10-17 – 2016-10-18 (×3): 5000 [IU] via SUBCUTANEOUS
  Filled 2016-10-17 (×3): qty 1

## 2016-10-17 MED ORDER — KETOROLAC TROMETHAMINE 30 MG/ML IJ SOLN
INTRAMUSCULAR | Status: AC
  Filled 2016-10-17: qty 1

## 2016-10-17 MED ORDER — LIDOCAINE HCL (CARDIAC) 20 MG/ML IV SOLN
INTRAVENOUS | Status: DC | PRN
  Administered 2016-10-17: 30 mg via INTRAVENOUS

## 2016-10-17 MED ORDER — DEXAMETHASONE SODIUM PHOSPHATE 10 MG/ML IJ SOLN
INTRAMUSCULAR | Status: AC
  Filled 2016-10-17: qty 1

## 2016-10-17 MED ORDER — EPHEDRINE SULFATE 50 MG/ML IJ SOLN
INTRAMUSCULAR | Status: DC | PRN
  Administered 2016-10-17: 10 mg via INTRAVENOUS
  Administered 2016-10-17: 5 mg via INTRAVENOUS

## 2016-10-17 MED ORDER — SUGAMMADEX SODIUM 200 MG/2ML IV SOLN
INTRAVENOUS | Status: AC
  Filled 2016-10-17: qty 2

## 2016-10-17 MED ORDER — ONDANSETRON HCL 4 MG/2ML IJ SOLN
INTRAMUSCULAR | Status: AC
  Filled 2016-10-17: qty 2

## 2016-10-17 MED ORDER — FENTANYL CITRATE (PF) 250 MCG/5ML IJ SOLN
INTRAMUSCULAR | Status: AC
  Filled 2016-10-17: qty 5

## 2016-10-17 MED ORDER — ONDANSETRON HCL 4 MG/2ML IJ SOLN
INTRAMUSCULAR | Status: DC | PRN
  Administered 2016-10-17: 4 mg via INTRAVENOUS

## 2016-10-17 MED ORDER — ROCURONIUM BROMIDE 50 MG/5ML IV SOLN
INTRAVENOUS | Status: AC
  Filled 2016-10-17: qty 1

## 2016-10-17 MED ORDER — DEXAMETHASONE SODIUM PHOSPHATE 10 MG/ML IJ SOLN
INTRAMUSCULAR | Status: DC | PRN
  Administered 2016-10-17: 10 mg via INTRAVENOUS

## 2016-10-17 MED ORDER — LACTATED RINGERS IV SOLN
INTRAVENOUS | Status: DC | PRN
  Administered 2016-10-17: 18:00:00 via INTRAVENOUS

## 2016-10-17 MED ORDER — AMPICILLIN-SULBACTAM SODIUM 3 (2-1) G IJ SOLR
3.0000 g | Freq: Four times a day (QID) | INTRAMUSCULAR | Status: DC
  Administered 2016-10-17 – 2016-10-18 (×6): 3 g via INTRAVENOUS
  Filled 2016-10-17 (×11): qty 3

## 2016-10-17 MED ORDER — MORPHINE SULFATE (PF) 2 MG/ML IV SOLN
2.0000 mg | INTRAVENOUS | Status: DC | PRN
  Administered 2016-10-17 (×2): 2 mg via INTRAVENOUS
  Filled 2016-10-17 (×3): qty 1

## 2016-10-17 MED ORDER — LACTATED RINGERS IV SOLN
INTRAVENOUS | Status: DC
  Administered 2016-10-17 (×3): via INTRAVENOUS

## 2016-10-17 MED ORDER — ACETAMINOPHEN 325 MG PO TABS
650.0000 mg | ORAL_TABLET | Freq: Four times a day (QID) | ORAL | Status: DC | PRN
  Administered 2016-10-17: 650 mg via ORAL
  Filled 2016-10-17: qty 2

## 2016-10-17 MED ORDER — FENTANYL CITRATE (PF) 100 MCG/2ML IJ SOLN
INTRAMUSCULAR | Status: AC
  Filled 2016-10-17: qty 2

## 2016-10-17 MED ORDER — PROMETHAZINE HCL 25 MG/ML IJ SOLN: 6.2500 mg | INTRAMUSCULAR | Status: DC | PRN

## 2016-10-17 MED ORDER — ONDANSETRON HCL 4 MG PO TABS: 4.0000 mg | ORAL_TABLET | Freq: Four times a day (QID) | ORAL | Status: DC | PRN

## 2016-10-17 MED ORDER — METOPROLOL SUCCINATE ER 50 MG PO TB24
50.0000 mg | ORAL_TABLET | Freq: Every day | ORAL | Status: DC
  Administered 2016-10-17 (×2): 50 mg via ORAL
  Filled 2016-10-17 (×2): qty 1

## 2016-10-17 MED ORDER — INSULIN ASPART 100 UNIT/ML ~~LOC~~ SOLN: 0.0000 [IU] | Freq: Three times a day (TID) | SUBCUTANEOUS | Status: DC

## 2016-10-17 MED ORDER — PROPOFOL 10 MG/ML IV BOLUS
INTRAVENOUS | Status: AC
  Filled 2016-10-17: qty 20

## 2016-10-17 MED ORDER — FENTANYL CITRATE (PF) 100 MCG/2ML IJ SOLN
INTRAMUSCULAR | Status: DC | PRN
  Administered 2016-10-17 (×3): 50 ug via INTRAVENOUS
  Administered 2016-10-17: 100 ug via INTRAVENOUS

## 2016-10-17 SURGICAL SUPPLY — 55 items
ADH SKN CLS APL DERMABOND .7 (GAUZE/BANDAGES/DRESSINGS) ×1
APPLICATOR COTTON TIP 6IN STRL (MISCELLANEOUS) ×3 IMPLANT
APPLIER CLIP 5 13 M/L LIGAMAX5 (MISCELLANEOUS) ×3
APR CLP MED LRG 5 ANG JAW (MISCELLANEOUS) ×1
BLADE SURG 15 STRL LF DISP TIS (BLADE) ×1 IMPLANT
BLADE SURG 15 STRL SS (BLADE) ×3
BULB RESERV EVAC DRAIN JP 100C (MISCELLANEOUS) ×2 IMPLANT
CANISTER SUCT 1200ML W/VALVE (MISCELLANEOUS) ×3 IMPLANT
CHLORAPREP W/TINT 26ML (MISCELLANEOUS) ×3 IMPLANT
CHOLANGIOGRAM CATH TAUT (CATHETERS) IMPLANT
CLEANER CAUTERY TIP 5X5 PAD (MISCELLANEOUS) ×1 IMPLANT
CLIP APPLIE 5 13 M/L LIGAMAX5 (MISCELLANEOUS) ×1 IMPLANT
DECANTER SPIKE VIAL GLASS SM (MISCELLANEOUS) ×2 IMPLANT
DERMABOND ADVANCED (GAUZE/BANDAGES/DRESSINGS) ×2
DERMABOND ADVANCED .7 DNX12 (GAUZE/BANDAGES/DRESSINGS) IMPLANT
DEVICE TROCAR PUNCTURE CLOSURE (ENDOMECHANICALS) IMPLANT
DRAIN CHANNEL JP 15F RND 16 (MISCELLANEOUS) ×2 IMPLANT
DRAPE C-ARM XRAY 36X54 (DRAPES) ×1 IMPLANT
ELECT CAUTERY BLADE 6.4 (BLADE) ×3 IMPLANT
ELECT REM PT RETURN 9FT ADLT (ELECTROSURGICAL) ×3
ELECTRODE REM PT RTRN 9FT ADLT (ELECTROSURGICAL) ×1 IMPLANT
ENDOLOOP SUT PDS II  0 18 (SUTURE) ×2
ENDOLOOP SUT PDS II 0 18 (SUTURE) IMPLANT
ENDOPOUCH RETRIEVER 10 (MISCELLANEOUS) ×3 IMPLANT
GLOVE BIO SURGEON STRL SZ7 (GLOVE) ×3 IMPLANT
GOWN STRL REUS W/ TWL LRG LVL3 (GOWN DISPOSABLE) ×3 IMPLANT
GOWN STRL REUS W/TWL LRG LVL3 (GOWN DISPOSABLE) ×9
IRRIGATION STRYKERFLOW (MISCELLANEOUS) ×1 IMPLANT
IRRIGATOR STRYKERFLOW (MISCELLANEOUS) ×3
IV CATH ANGIO 12GX3 LT BLUE (NEEDLE) ×1 IMPLANT
IV NS 1000ML (IV SOLUTION) ×3
IV NS 1000ML BAXH (IV SOLUTION) ×1 IMPLANT
L-HOOK LAP DISP 36CM (ELECTROSURGICAL) ×3
LHOOK LAP DISP 36CM (ELECTROSURGICAL) ×1 IMPLANT
LIQUID BAND (GAUZE/BANDAGES/DRESSINGS) ×1 IMPLANT
NEEDLE HYPO 22GX1.5 SAFETY (NEEDLE) ×3 IMPLANT
PACK LAP CHOLECYSTECTOMY (MISCELLANEOUS) ×3 IMPLANT
PAD CLEANER CAUTERY TIP 5X5 (MISCELLANEOUS) ×2
PENCIL ELECTRO HAND CTR (MISCELLANEOUS) ×3 IMPLANT
SCISSORS METZENBAUM CVD 33 (INSTRUMENTS) ×3 IMPLANT
SLEEVE ENDOPATH XCEL 5M (ENDOMECHANICALS) ×6 IMPLANT
SOL ANTI-FOG 6CC FOG-OUT (MISCELLANEOUS) ×1 IMPLANT
SOL FOG-OUT ANTI-FOG 6CC (MISCELLANEOUS) ×2
SPONGE LAP 18X18 5 PK (GAUZE/BANDAGES/DRESSINGS) ×3 IMPLANT
STOPCOCK 3 WAY  REPLAC (MISCELLANEOUS) IMPLANT
SUT ETHIBOND 0 MO6 C/R (SUTURE) IMPLANT
SUT ETHILON 3-0 KS 30 BLK (SUTURE) ×2 IMPLANT
SUT MNCRL AB 4-0 PS2 18 (SUTURE) ×3 IMPLANT
SUT VIC AB 0 CT2 27 (SUTURE) IMPLANT
SUT VICRYL 0 AB UR-6 (SUTURE) ×6 IMPLANT
SYR 20CC LL (SYRINGE) ×3 IMPLANT
TROCAR XCEL BLUNT TIP 100MML (ENDOMECHANICALS) ×3 IMPLANT
TROCAR XCEL NON-BLD 5MMX100MML (ENDOMECHANICALS) ×3 IMPLANT
TUBING INSUFFLATOR HI FLOW (MISCELLANEOUS) ×3 IMPLANT
WATER STERILE IRR 1000ML POUR (IV SOLUTION) ×3 IMPLANT

## 2016-10-17 NOTE — Anesthesia Procedure Notes (Signed)
Procedure Name: Intubation Date/Time: 10/17/2016 6:21 PM Performed by: Lendon Colonel Pre-anesthesia Checklist: Patient identified, Emergency Drugs available, Suction available, Patient being monitored and Timeout performed Patient Re-evaluated:Patient Re-evaluated prior to inductionOxygen Delivery Method: Circle system utilized Preoxygenation: Pre-oxygenation with 100% oxygen Intubation Type: IV induction Ventilation: Mask ventilation without difficulty Laryngoscope Size: Miller and 2 Grade View: Grade I Tube type: Oral Number of attempts: 1 Airway Equipment and Method: Stylet Placement Confirmation: ETT inserted through vocal cords under direct vision Secured at: 20 cm Tube secured with: Tape Dental Injury: Teeth and Oropharynx as per pre-operative assessment

## 2016-10-17 NOTE — Anesthesia Preprocedure Evaluation (Signed)
Anesthesia Evaluation  Patient identified by MRN, date of birth, ID band Patient awake    Reviewed: Allergy & Precautions, H&P , NPO status , Patient's Chart, lab work & pertinent test results, reviewed documented beta blocker date and time   History of Anesthesia Complications Negative for: history of anesthetic complications  Airway Mallampati: I  TM Distance: >3 FB Neck ROM: full    Dental  (+) Edentulous Upper, Edentulous Lower   Pulmonary neg pulmonary ROS,           Cardiovascular Exercise Tolerance: Good hypertension, (-) angina(-) CAD, (-) Past MI, (-) Cardiac Stents and (-) CABG (-) dysrhythmias (-) Valvular Problems/Murmurs     Neuro/Psych negative neurological ROS  negative psych ROS   GI/Hepatic Neg liver ROS, GERD  ,  Endo/Other  diabetesHypothyroidism   Renal/GU negative Renal ROS  negative genitourinary   Musculoskeletal   Abdominal   Peds  Hematology negative hematology ROS (+)   Anesthesia Other Findings Past Medical History: No date: Diabetes mellitus No date: Hyperlipidemia No date: Hypertension No date: Hypothyroid No date: Insomnia   Reproductive/Obstetrics negative OB ROS                             Anesthesia Physical Anesthesia Plan  ASA: III  Anesthesia Plan: General   Post-op Pain Management:    Induction:   Airway Management Planned:   Additional Equipment:   Intra-op Plan:   Post-operative Plan:   Informed Consent: I have reviewed the patients History and Physical, chart, labs and discussed the procedure including the risks, benefits and alternatives for the proposed anesthesia with the patient or authorized representative who has indicated his/her understanding and acceptance.   Dental Advisory Given  Plan Discussed with: Anesthesiologist, CRNA and Surgeon  Anesthesia Plan Comments:         Anesthesia Quick Evaluation

## 2016-10-17 NOTE — H&P (Signed)
Sherry Fields is an 61 y.o. female.    Chief Complaint: Right upper quadrant pain  HPI: This patient with 3 days of right upper quadrant pain it came on gradually was not associated with eating necessarily as she can recall. She's had multiple emesis on Friday and Saturday none today. She's had no fevers or chills no jaundice or acholic stools and has never had an episode like this before. She does have a family history of gallbladder disease. She was initially worked up for urinary tract infection because of her right flank pain where her pain radiates to her right flank and she definitely had a urinalysis that was dirty but a further workup showed gallstones and acute cholecystitis. Asked see the patient by the emergency room physician was spoke to personally concerning her care.  Past Medical History:  Diagnosis Date  . Diabetes mellitus   . Hyperlipidemia   . Hypertension   . Hypothyroid   . Insomnia     Past Surgical History:  Procedure Laterality Date  . THYROIDECTOMY    Patient is also had a C-section  Family History  Problem Relation Age of Onset  . Coronary artery disease Mother   . Coronary artery disease Father    Social History:  reports that she has never smoked. Her smokeless tobacco use includes Snuff. She reports that she does not drink alcohol or use drugs. Patient does not work she does not smoke nor does she drink. Family history of gallbladder disease Allergies:  Allergies  Allergen Reactions  . Naproxen Anaphylaxis  . Poison Sumac Extract Hives and Rash     (Not in a hospital admission)   Review of Systems  Constitutional: Negative.   HENT: Negative.   Eyes: Negative.   Respiratory: Negative.   Cardiovascular: Negative.   Gastrointestinal: Positive for abdominal pain, nausea and vomiting. Negative for blood in stool, constipation, diarrhea and heartburn.  Genitourinary: Negative.   Musculoskeletal: Negative.   Skin: Negative.   Neurological:  Negative.   Endo/Heme/Allergies: Negative.   Psychiatric/Behavioral: Negative.      Physical Exam:  BP 121/76   Pulse 93   Temp 99.1 F (37.3 C) (Oral)   Resp 16   Ht 5' 3"  (1.6 m)   Wt 168 lb (76.2 kg)   SpO2 94%   BMI 29.76 kg/m   Physical Exam  Constitutional: She is oriented to person, place, and time and well-developed, well-nourished, and in no distress. No distress.  HENT:  Head: Normocephalic and atraumatic.  Poor dentition  Eyes: Pupils are equal, round, and reactive to light. Right eye exhibits no discharge. Left eye exhibits no discharge. No scleral icterus.  Neck: Normal range of motion.  Cardiovascular: Normal rate, regular rhythm and normal heart sounds.   Pulmonary/Chest: Effort normal and breath sounds normal. No respiratory distress. She has no wheezes. She has no rales.  Abdominal: Soft. She exhibits no distension. There is tenderness. There is no rebound and no guarding.  Tenderness right upper quadrant with questionable Murphy sign  Musculoskeletal: Normal range of motion. She exhibits no edema or tenderness.  Lymphadenopathy:    She has no cervical adenopathy.  Neurological: She is alert and oriented to person, place, and time.  Skin: Skin is warm and dry. No rash noted. She is not diaphoretic. No erythema.  Psychiatric: Mood and affect normal.  Vitals reviewed.       Results for orders placed or performed during the hospital encounter of 10/16/16 (from the past 48 hour(s))  Basic metabolic panel     Status: Abnormal   Collection Time: 10/16/16  6:07 PM  Result Value Ref Range   Sodium 131 (L) 135 - 145 mmol/L   Potassium 3.8 3.5 - 5.1 mmol/L   Chloride 95 (L) 101 - 111 mmol/L   CO2 27 22 - 32 mmol/L   Glucose, Bld 280 (H) 65 - 99 mg/dL   BUN 18 6 - 20 mg/dL   Creatinine, Ser 1.54 (H) 0.44 - 1.00 mg/dL   Calcium 7.8 (L) 8.9 - 10.3 mg/dL   GFR calc non Af Amer 36 (L) >60 mL/min   GFR calc Af Amer 42 (L) >60 mL/min    Comment: (NOTE) The  eGFR has been calculated using the CKD EPI equation. This calculation has not been validated in all clinical situations. eGFR's persistently <60 mL/min signify possible Chronic Kidney Disease.    Anion gap 9 5 - 15  CBC     Status: Abnormal   Collection Time: 10/16/16  6:07 PM  Result Value Ref Range   WBC 19.1 (H) 3.6 - 11.0 K/uL   RBC 4.09 3.80 - 5.20 MIL/uL   Hemoglobin 12.3 12.0 - 16.0 g/dL   HCT 35.3 35.0 - 47.0 %   MCV 86.4 80.0 - 100.0 fL   MCH 30.0 26.0 - 34.0 pg   MCHC 34.8 32.0 - 36.0 g/dL   RDW 13.5 11.5 - 14.5 %   Platelets 197 150 - 440 K/uL  Urinalysis, Routine w reflex microscopic     Status: Abnormal   Collection Time: 10/16/16  6:07 PM  Result Value Ref Range   Color, Urine AMBER (A) YELLOW    Comment: BIOCHEMICALS MAY BE AFFECTED BY COLOR   APPearance CLOUDY (A) CLEAR   Specific Gravity, Urine 1.018 1.005 - 1.030   pH 5.0 5.0 - 8.0   Glucose, UA 50 (A) NEGATIVE mg/dL   Hgb urine dipstick NEGATIVE NEGATIVE   Bilirubin Urine NEGATIVE NEGATIVE   Ketones, ur NEGATIVE NEGATIVE mg/dL   Protein, ur 100 (A) NEGATIVE mg/dL   Nitrite NEGATIVE NEGATIVE   Leukocytes, UA LARGE (A) NEGATIVE   RBC / HPF 0-5 0 - 5 RBC/hpf   WBC, UA TOO NUMEROUS TO COUNT 0 - 5 WBC/hpf   Bacteria, UA NONE SEEN NONE SEEN   Squamous Epithelial / LPF 0-5 (A) NONE SEEN   Mucous PRESENT    Hyaline Casts, UA PRESENT    Non Squamous Epithelial 0-5 (A) NONE SEEN  Hepatic function panel     Status: Abnormal   Collection Time: 10/16/16  6:07 PM  Result Value Ref Range   Total Protein 7.2 6.5 - 8.1 g/dL   Albumin 3.3 (L) 3.5 - 5.0 g/dL   AST 17 15 - 41 U/L    Comment: RESULT CONFIRMED BY MANUAL DILUTION.PMH   ALT 15 14 - 54 U/L   Alkaline Phosphatase 33 (L) 38 - 126 U/L   Total Bilirubin 0.8 0.3 - 1.2 mg/dL   Bilirubin, Direct 0.3 0.1 - 0.5 mg/dL   Indirect Bilirubin 0.5 0.3 - 0.9 mg/dL  Lipase, blood     Status: Abnormal   Collection Time: 10/16/16  6:07 PM  Result Value Ref Range    Lipase <10 (L) 11 - 51 U/L   Ct Renal Stone Study  Result Date: 10/16/2016 CLINICAL DATA:  Right flank pain for 2 days. UTI and possible pyelonephritis. EXAM: CT ABDOMEN AND PELVIS WITHOUT CONTRAST TECHNIQUE: Multidetector CT imaging of the abdomen and pelvis was  performed following the standard protocol without IV contrast. COMPARISON:  03/02/2010 FINDINGS: Lower chest: Subsegmental atelectasis in the lung bases. No pleural effusion. Normal heart size. Hepatobiliary: Punctate calcification in the liver. Hydropic, thick-walled gallbladder containing small stones and/or debris and with moderate pericholecystic inflammatory stranding. No biliary dilatation. Pancreas: Unremarkable. Spleen: Unremarkable. Adrenals/Urinary Tract: Unremarkable adrenal glands. No evidence of renal mass, hydronephrosis, or urinary tract calculi. Unremarkable bladder. Stomach/Bowel: The stomach is within normal limits. There is no evidence of bowel obstruction. There is descending and sigmoid colon diverticulosis without evidence of diverticulitis. The appendix is unremarkable. Vascular/Lymphatic: Mild abdominal aortic atherosclerosis without aneurysm. Numerous small retroperitoneal lymph nodes measuring up to 6 mm in size, stable to minimally increased from the prior study and likely benign. Reproductive: Interval calcification of the previously seen uterine fibroid. Unchanged appearance of the left ovary without adnexal mass. Poor delineation of the right ovary. Other: No intraperitoneal free fluid. No abdominal wall mass or hernia. Musculoskeletal: No acute osseous abnormality or suspicious osseous lesion. IMPRESSION: 1. Hydropic, inflamed gallbladder consistent with acute cholecystitis. 2. No evidence of urinary tract calculi or obstruction. 3. Uterine fibroid. 4. Colonic diverticulosis. 5. Aortic atherosclerosis. Electronically Signed   By: Logan Bores M.D.   On: 10/16/2016 21:33   US Abdomen Limited Ruq  Result Date:  10/16/2016 CLINICAL DATA:  Right flank pain for 2 days. Acute cholecystitis demonstrated at CT. EXAM: US ABDOMEN LIMITED - RIGHT UPPER QUADRANT COMPARISON:  CT abdomen and pelvis 10/16/2016 FINDINGS: Gallbladder: Gallbladder is distended with small stones and sludge layering in the gallbladder. Diffuse wall thickening and edema, measuring up to 10 mm. Murphy's sign is positive. Common bile duct: Diameter: 4.6 mm, normal Liver: No focal lesion identified. Within normal limits in parenchymal echogenicity. IMPRESSION: Gallbladder distention with wall thickening, pericholecystic edema, stones, and sludge. Positive Murphy's sign. Changes are consistent with acute cholecystitis in the appropriate clinical setting. Electronically Signed   By: Lucienne Capers M.D.   On: 10/16/2016 23:24     Assessment/Plan  This patient with diabetes hypertension and although she is not notably in the past her labs suggest renal failure. Her creatinine is 1.5. She'll be admitted the hospital hydrated. She will also be considered for laparoscopic cholecystectomy once her creatinine is improved and she's been on some antibiotics. She does have urinary tract infection as well and antibiotics of been started for that. I described for her the procedure itself the risks of bleeding infection negative laparoscopy conversion to an open procedure and recurrent symptoms this reviewed for she and her family they understood and agreed to proceed but we will plan with hydration first and I will discuss her care with Dr. Dahlia Byes.  Florene Glen, MD, FACS

## 2016-10-17 NOTE — Op Note (Signed)
Laparoscopic Cholecystectomy  Pre-operative Diagnosis: cholecystitis  Post-operative Diagnosis: sa,e  Procedure: laparoscopic cholecystectomy  Surgeon: Caroleen Hamman, MD FACS  Anesthesia: Gen. with endotracheal tube  Findings: Gangrenous Cholecystitis w empyema of the GB  Estimated Blood Loss: 50cc         Drains: Blake drain RUQ         Specimens: Gallbladder           Complications: none   Procedure Details  The patient was seen again in the Holding Room. The benefits, complications, treatment options, and expected outcomes were discussed with the patient. The risks of bleeding, infection, recurrence of symptoms, failure to resolve symptoms, bile duct damage, bile duct leak, retained common bile duct stone, bowel injury, any of which could require further surgery and/or ERCP, stent, or papillotomy were reviewed with the patient. The likelihood of improving the patient's symptoms with return to their baseline status is good.  The patient and/or family concurred with the proposed plan, giving informed consent.  The patient was taken to Operating Room, identified as DIMA MINI and the procedure verified as Laparoscopic Cholecystectomy.  A Time Out was held and the above information confirmed.  Prior to the induction of general anesthesia, antibiotic prophylaxis was administered. VTE prophylaxis was in place. General endotracheal anesthesia was then administered and tolerated well. After the induction, the abdomen was prepped with Chloraprep and draped in the sterile fashion. The patient was positioned in the supine position.  Local anesthetic  was injected into the skin near the umbilicus and an incision made. Cut down technique was used to enter the abdominal cavity and a Hasson trochar was placed after two vicryl stitches were anchored to the fascia. Pneumoperitoneum was then created with CO2 and tolerated well without any adverse changes in the patient's vital signs.  Three 5-mm  ports were placed in the right upper quadrant all under direct vision. All skin incisions  were infiltrated with a local anesthetic agent before making the incision and placing the trocars.   The patient was positioned  in reverse Trendelenburg, tilted slightly to the patient's left.  The gallbladder was identified, the fundus grasped and retracted cephalad. Gallbladder was aspirated obtaining purulent fluid. Adhesions were lysed bluntly. The infundibulum was grasped and retracted laterally, exposing the peritoneum overlying the triangle of Calot. This was then divided and exposed in a blunt fashion. Given the degree of inflammation I had to do a dome down technique, with electrocautery and blunt dissection the GB was removed from its fossa.     An extended critical view of the cystic duct and cystic artery was obtained.  The cystic duct was clearly identified and bluntly dissected.   Artery and duct were double clipped and divided. There was some wall of the GB that was thick and I placed a loop PDS and then divided this tissue.   The liver bed was irrigated and inspected. Hemostasis was achieved with the electrocautery. Copious irrigation was utilized and was repeatedly aspirated until clear.  The gallbladder and Endocatch sac were then removed.   Inspection of the right upper quadrant was performed. No bleeding, bile duct injury or leak, or bowel injury was noted.Blake drain placed in the GB fossa and secured w 3-0 nylon.  Pneumoperitoneum was released.  The periumbilical port site was closed with figure-of-eight 0 Vicryl sutures. 4-0 subcuticular Monocryl was used to close the skin. Dermabond was  applied.  The patient was then extubated and brought to the recovery room in stable  condition. Sponge, lap, and needle counts were correct at closure and at the conclusion of the case.               Caroleen Hamman, MD, FACS

## 2016-10-17 NOTE — Progress Notes (Signed)
Pt w acute cholecystitis evident on PE, recommend cholecystectomy The risks, benefits, complications, treatment options, and expected outcomes were discussed with the patient. The possibilities of bleeding, recurrent infection, finding a normal gallbladder, perforation of viscus organs, damage to surrounding structures, bile leak, abscess formation, needing a drain placed, the need for additional procedures, reaction to medication, pulmonary aspiration,  failure to diagnose a condition, the possible need to convert to an open procedure, and creating a complication requiring transfusion or operation were discussed with the patient. The patient and/or family concurred with the proposed plan, giving informed consent.

## 2016-10-17 NOTE — ED Notes (Signed)
Report from kate, rn.  

## 2016-10-17 NOTE — ED Notes (Signed)
Dr Cooper in to see pt  

## 2016-10-17 NOTE — Anesthesia Post-op Follow-up Note (Cosign Needed)
Anesthesia QCDR form completed.        

## 2016-10-17 NOTE — Transfer of Care (Signed)
Immediate Anesthesia Transfer of Care Note  Patient: Sherry Fields  Procedure(s) Performed: Procedure(s): LAPAROSCOPIC CHOLECYSTECTOMY  (no gram) (N/A)  Patient Location: PACU  Anesthesia Type:General  Level of Consciousness: awake, alert , oriented and patient cooperative  Airway & Oxygen Therapy: Patient Spontanous Breathing and Patient connected to face mask oxygen  Post-op Assessment: Report given to RN and Post -op Vital signs reviewed and stable  Post vital signs: Reviewed and stable  Last Vitals:  Vitals:   10/17/16 1659 10/17/16 1943  BP: 118/74 127/88  Pulse: 74 79  Resp: 16   Temp: 36.9 C (!) 36.1 C    Last Pain:  Vitals:   10/17/16 1659  TempSrc: Oral  PainSc:          Complications: No apparent anesthesia complications

## 2016-10-18 ENCOUNTER — Encounter: Payer: Self-pay | Admitting: Surgery

## 2016-10-18 LAB — COMPREHENSIVE METABOLIC PANEL
ALBUMIN: 2.7 g/dL — AB (ref 3.5–5.0)
ALT: 45 U/L (ref 14–54)
ANION GAP: 8 (ref 5–15)
AST: 87 U/L — AB (ref 15–41)
Alkaline Phosphatase: 57 U/L (ref 38–126)
BILIRUBIN TOTAL: 2.4 mg/dL — AB (ref 0.3–1.2)
BUN: 13 mg/dL (ref 6–20)
CO2: 28 mmol/L (ref 22–32)
Calcium: 7.6 mg/dL — ABNORMAL LOW (ref 8.9–10.3)
Chloride: 100 mmol/L — ABNORMAL LOW (ref 101–111)
Creatinine, Ser: 0.91 mg/dL (ref 0.44–1.00)
GFR calc Af Amer: >60 mL/min (ref >60)
GFR calc non Af Amer: >60 mL/min (ref >60)
GLUCOSE: 310 mg/dL — AB (ref 65–99)
Potassium: 4.6 mmol/L (ref 3.5–5.1)
SODIUM: 136 mmol/L (ref 135–145)
Total Protein: 6.1 g/dL — ABNORMAL LOW (ref 6.5–8.1)

## 2016-10-18 LAB — CBC
HCT: 30.5 % — ABNORMAL LOW (ref 35.0–47.0)
HEMOGLOBIN: 10.7 g/dL — AB (ref 12.0–16.0)
MCH: 30.1 pg (ref 26.0–34.0)
MCHC: 35 g/dL (ref 32.0–36.0)
MCV: 86 fL (ref 80.0–100.0)
Platelets: 209 10*3/uL (ref 150–440)
RBC: 3.54 MIL/uL — ABNORMAL LOW (ref 3.80–5.20)
RDW: 13.5 % (ref 11.5–14.5)
WBC: 10.7 10*3/uL (ref 3.6–11.0)

## 2016-10-18 LAB — HIV ANTIBODY (ROUTINE TESTING W REFLEX): HIV Screen 4th Generation wRfx: NONREACTIVE

## 2016-10-18 LAB — URINE CULTURE: SPECIAL REQUESTS: NORMAL

## 2016-10-18 LAB — GLUCOSE, CAPILLARY
GLUCOSE-CAPILLARY: 273 mg/dL — AB (ref 65–99)
GLUCOSE-CAPILLARY: 289 mg/dL — AB (ref 65–99)

## 2016-10-18 MED ORDER — METRONIDAZOLE 500 MG PO TABS: 500.0000 mg | ORAL_TABLET | Freq: Three times a day (TID) | ORAL | 0 refills | Status: DC

## 2016-10-18 MED ORDER — CIPROFLOXACIN HCL 500 MG PO TABS: 500.0000 mg | ORAL_TABLET | Freq: Two times a day (BID) | ORAL | 0 refills | Status: DC

## 2016-10-18 MED ORDER — HYDROCODONE-ACETAMINOPHEN 5-325 MG PO TABS: 1.0000 | ORAL_TABLET | Freq: Four times a day (QID) | ORAL | 0 refills | Status: DC | PRN

## 2016-10-18 NOTE — Progress Notes (Signed)
10/17/2016 at 2230: Instructed visitors to not sleep on window-ledge/seal; Don't want them to fall off while asleep; visitor told this RN that she was not going to fall off the ledge, that she slept there the night before and was OK; Barbaraann Faster, RN 2:21 AM 10/18/2016

## 2016-10-18 NOTE — Progress Notes (Signed)
IV was removed. Discharge instructions, follow-up appointments, and prescriptions were provided to the pt and daughter at bedside. JP drain teaching was provided. All questions were answered. The pt was taken downstairs via wheelchair by volunteer services.

## 2016-10-18 NOTE — Consult Note (Signed)
Pt is discharged and gone from the floor, when I came to see her. Consult is not done.

## 2016-10-18 NOTE — Discharge Summary (Signed)
Patient ID: Sherry Fields MRN: [id number] DOB/AGE: 1956/05/08 61 y.o.  Admit date: 10/16/2016 Discharge date: 10/18/2016   Discharge Diagnoses:  Active Problems:   Acute cholecystitis   Procedures: laparoscopic cholecystectomy  Hospital Course: admitted with acute cholecystitis, placed on IV antibioitcs and resuscitated. Underwent a difficult but uneventful lap chole. Gangrenous cholecystitis w empyema. She did very well post operatively. At the time of DC she was ambulating, tolerating diet AVSS. Her PE showed a female in NAD, alert. Abd: soft, JP serosanguinous drainage, no bile. No infection or peritonitis. Ext: no edema and well perfused. Condition at DC is stable. She will need an additional week of A/Bs.  Consults: IM for DM  Disposition: 01-Home or Self Care  Discharge Instructions    Call MD for:  difficulty breathing, headache or visual disturbances    Complete by:  As directed    Call MD for:  extreme fatigue    Complete by:  As directed    Call MD for:  hives    Complete by:  As directed    Call MD for:  persistant dizziness or light-headedness    Complete by:  As directed    Call MD for:  persistant nausea and vomiting    Complete by:  As directed    Call MD for:  redness, tenderness, or signs of infection (pain, swelling, redness, odor or green/yellow discharge around incision site)    Complete by:  As directed    Call MD for:  severe uncontrolled pain    Complete by:  As directed    Call MD for:  temperature >100.4    Complete by:  As directed    Diet - low sodium heart healthy    Complete by:  As directed    Discharge instructions    Complete by:  As directed    Please teach pt JP care, shower starting in am   Increase activity slowly    Complete by:  As directed    Lifting restrictions    Complete by:  As directed    20 lbs x 6 wks     Allergies as of 10/18/2016      Reactions   Naproxen Anaphylaxis   Poison Sumac Extract Hives, Rash       Medication List    TAKE these medications   ALPRAZolam 0.5 MG tablet Commonly known as:  XANAX Take 1 tablet (0.5 mg total) by mouth at bedtime as needed for sleep.   ciprofloxacin 500 MG tablet Commonly known as:  CIPRO Take 1 tablet (500 mg total) by mouth 2 (two) times daily.   diphenhydrAMINE 25 mg capsule Commonly known as:  BENADRYL Take 25 mg by mouth at bedtime.   EPIPEN 0.3 mg/0.3 mL Devi Generic drug:  EPINEPHrine Inject 0.3 mg into the muscle once.   HYDROcodone-acetaminophen 5-325 MG tablet Commonly known as:  NORCO/VICODIN Take 1-2 tablets by mouth every 6 (six) hours as needed for moderate pain.   insulin detemir 100 UNIT/ML injection Commonly known as:  LEVEMIR Inject 15 Units into the skin at bedtime.   levothyroxine 100 MCG tablet Commonly known as:  SYNTHROID, LEVOTHROID Take 100 mcg by mouth at bedtime.   losartan 50 MG tablet Commonly known as:  COZAAR Take 50 mg by mouth at bedtime.   metFORMIN 1000 MG tablet Commonly known as:  GLUCOPHAGE Take 1,000 mg by mouth 2 (two) times daily.   metoprolol succinate 50 MG 24 hr tablet Commonly known as:  TOPROL-XL  Take 50 mg by mouth at bedtime.   metroNIDAZOLE 500 MG tablet Commonly known as:  FLAGYL Take 1 tablet (500 mg total) by mouth 3 (three) times daily.   pravastatin 20 MG tablet Commonly known as:  PRAVACHOL Take 20 mg by mouth at bedtime.   sitaGLIPtin 100 MG tablet Commonly known as:  JANUVIA Take 100 mg by mouth at bedtime.   zolpidem 10 MG tablet Commonly known as:  AMBIEN Take 10 mg by mouth at bedtime as needed. For sleep      Follow-up Information    Jules Husbands, MD In 1 week.   Specialty:  General Surgery Why:  Dr. Dahlia Byes office will notify you with your follow-up Contact information: [address] [place] [address] [phone]            Caroleen Hamman, MD FACS

## 2016-10-19 ENCOUNTER — Other Ambulatory Visit: Payer: Self-pay

## 2016-10-19 LAB — SURGICAL PATHOLOGY

## 2016-10-20 ENCOUNTER — Ambulatory Visit (INDEPENDENT_AMBULATORY_CARE_PROVIDER_SITE_OTHER): Payer: Medicare Other | Admitting: Surgery

## 2016-10-20 ENCOUNTER — Telehealth: Payer: Self-pay | Admitting: Surgery

## 2016-10-20 ENCOUNTER — Other Ambulatory Visit: Payer: Self-pay

## 2016-10-20 VITALS — BP 149/90 | HR 61 | Temp 97.8°F | Ht 63.0 in | Wt 165.4 lb

## 2016-10-20 DIAGNOSIS — Z09 Encounter for follow-up examination after completed treatment for conditions other than malignant neoplasm: Secondary | ICD-10-CM

## 2016-10-20 NOTE — Telephone Encounter (Signed)
Pt forgot to mention her lower back and side pain when she was in yesterday for nurse visit.

## 2016-10-20 NOTE — Telephone Encounter (Signed)
Patient's daughter Adonis Brook) called [place] back. She stated that the reason they are calling is because her mother came in yesterday to our office for [place] to look at her incision since she was draining a lot from her JP Drain incision. She was told that it was normal. Therefore, she went home.  Today her daughter stated that her drain seems to be clogged since not much had drained. However, the JP drain fluid has a brown looking color to it. She also stated that her drainage incision is draining too much. They were concerned and wanted to be seen today just in case something is wrong. Adonis Brook denied her mother having a fever, chills, redness on her incision. I told Adonis Brook to bring her in at 3:15 PM today. They both agreed.

## 2016-10-20 NOTE — Progress Notes (Signed)
10/20/2016  HPI: Patient is status post laparoscopic cholecystectomy for gangrenous cholecystitis on 3/19 with Dr. Dahlia Byes. She was discharged on 3/20 with a JP drain in place. Patient reports she still having pain on her right upper side with decreased appetite. Denies any significant drainage from the wounds and her JP drain has been draining more than 30 cc per day.  Vital signs: BP (!) 149/90   Pulse 61   Temp 97.8 F (36.6 C) (Oral)   Ht 5\' 3"  (1.6 m)   Wt 75 kg (165 lb 6.4 oz)   BMI 29.30 kg/m    Physical Exam: Constitutional: No acute distress Abdomen:  Soft, nondistended, with tenderness to palpation in the right upper quadrant is improving. No peritoneal signs. JP drain with serosanguineous fluid. Incisions are clean dry and intact with no evidence of infection.  Assessment/Plan: 61 year old female status post laparoscopic cholecystectomy.  -Given that her drain is draining more than 30 cc per day, we'll continue the drain in place for now. Instructed the patient how to strip the drain and to record the drainage was daily. -Encouraged patient to eat more so she can gain more calories and continue with the wound healing. -Patient follow-up next week with Dr. Dahlia Byes for another wound check and possible JP removal at that time. -Patient will continue her antibiotics as prescribed.   Melvyn Neth, [place]

## 2016-10-20 NOTE — Anesthesia Postprocedure Evaluation (Signed)
Anesthesia Post Note  Patient: Sherry Fields  Procedure(s) Performed: Procedure(s) (LRB): LAPAROSCOPIC CHOLECYSTECTOMY  (no gram) (N/A)  Patient location during evaluation: PACU Anesthesia Type: General Level of consciousness: awake and alert Pain management: pain level controlled Vital Signs Assessment: post-procedure vital signs reviewed and stable Respiratory status: spontaneous breathing, nonlabored ventilation, respiratory function stable and patient connected to nasal cannula oxygen Cardiovascular status: blood pressure returned to baseline and stable Postop Assessment: no signs of nausea or vomiting Anesthetic complications: no     Last Vitals:  Vitals:   10/18/16 0552 10/18/16 0557  BP: (!) 92/58 (!) 96/57  Pulse: 60 60  Resp: 18   Temp: 36.4 C     Last Pain:  Vitals:   10/18/16 0834  TempSrc:   PainSc: 0-No pain                 Martha Clan

## 2016-10-20 NOTE — Patient Instructions (Signed)
Continue emptying your JP drain twice daily and recording the output on your drain record. BRING this with you to your next appointment.  Place a dressing to the drain site daily and if soiled.  Continue your antibiotic until completed.  Keep your appointment as scheduled next week.  Low-Fat Diet for Gallbladder Conditions A low-fat diet can be helpful if you have pancreatitis or a gallbladder condition. With these conditions, your pancreas and gallbladder have trouble digesting fats. A healthy eating plan with less fat will help rest your pancreas and gallbladder and reduce your symptoms. What do I need to know about this diet?  Eat a low-fat diet.  Reduce your fat intake to less than 20-30% of your total daily calories. This is less than 50-60 g of fat per day.  Remember that you need some fat in your diet. Ask your dietician what your daily goal should be.  Choose nonfat and low-fat healthy foods. Look for the words "nonfat," "low fat," or "fat free."  As a guide, look on the label and choose foods with less than 3 g of fat per serving. Eat only one serving.  Avoid alcohol.  Do not smoke. If you need help quitting, talk with your health care provider.  Eat small frequent meals instead of three large heavy meals. What foods can I eat? Grains  Include healthy grains and starches such as potatoes, wheat bread, fiber-rich cereal, and brown rice. Choose whole grain options whenever possible. In adults, whole grains should account for 45-65% of your daily calories. Fruits and Vegetables  Eat plenty of fruits and vegetables. Fresh fruits and vegetables add fiber to your diet. Meats and Other Protein Sources  Eat lean meat such as chicken and pork. Trim any fat off of meat before cooking it. Eggs, fish, and beans are other sources of protein. In adults, these foods should account for 10-35% of your daily calories. Dairy  Choose low-fat milk and dairy options. Dairy includes fat and  protein, as well as calcium. Fats and Oils  Limit high-fat foods such as fried foods, sweets, baked goods, sugary drinks. Other  Creamy sauces and condiments, such as mayonnaise, can add extra fat. Think about whether or not you need to use them, or use smaller amounts or low fat options. What foods are not recommended?  High fat foods, such as:  [employer].  Ice cream.  [place] toast.  Sweet rolls.  Pizza.  Cheese bread.  Foods covered with batter, butter, creamy sauces, or cheese.  Fried foods.  Sugary drinks and desserts.  Foods that cause gas or bloating This information is not intended to replace advice given to you by your health care provider. Make sure you discuss any questions you have with your health care provider. Document Released: 07/23/2013 Document Revised: 12/24/2015 Document Reviewed: 07/01/2013 Elsevier Interactive Patient Education  2017 [employer].

## 2016-10-20 NOTE — Telephone Encounter (Signed)
Call returned to patient at this time. No answer. Left voicemail for return phone call.

## 2016-10-26 ENCOUNTER — Encounter: Payer: Self-pay | Admitting: Surgery

## 2016-10-26 ENCOUNTER — Ambulatory Visit (INDEPENDENT_AMBULATORY_CARE_PROVIDER_SITE_OTHER): Payer: Medicare Other | Admitting: Surgery

## 2016-10-26 VITALS — BP 155/92 | HR 71 | Temp 98.2°F | Ht 63.0 in | Wt 160.2 lb

## 2016-10-26 DIAGNOSIS — Z09 Encounter for follow-up examination after completed treatment for conditions other than malignant neoplasm: Secondary | ICD-10-CM

## 2016-10-26 NOTE — Patient Instructions (Signed)
We removed your drain today. Please keep the drain site covered with a clean dressing daily until it closes completely. Please call our office if you have questions or concerns.

## 2016-10-26 NOTE — Progress Notes (Signed)
s/p lap chole 3/19d drain Doing well Taking PO AVSS Minimal serous drainage from JP  PE NAD Abd: incision c/d/I, JP removed  A/P Doing well No heavy lifting  RTC prn Path d/w pt

## 2017-01-23 ENCOUNTER — Telehealth: Payer: Self-pay | Admitting: General Practice

## 2017-01-23 ENCOUNTER — Ambulatory Visit: Payer: Medicare Other | Admitting: Internal Medicine

## 2017-01-23 DIAGNOSIS — Z0289 Encounter for other administrative examinations: Secondary | ICD-10-CM

## 2017-01-23 NOTE — Telephone Encounter (Signed)
No follow up needed. Yes charge the NSF

## 2017-01-23 NOTE — Telephone Encounter (Signed)
Patient did not come in for their appointment today for new pt appt  Please let me know if patient needs to be contacted immediately for follow up or no follow up needed. Do you want to charge the NSF? °

## 2017-12-15 ENCOUNTER — Emergency Department (HOSPITAL_COMMUNITY): Payer: Medicare HMO

## 2017-12-15 ENCOUNTER — Encounter (HOSPITAL_COMMUNITY): Payer: Self-pay | Admitting: Emergency Medicine

## 2017-12-15 ENCOUNTER — Other Ambulatory Visit: Payer: Self-pay

## 2017-12-15 ENCOUNTER — Emergency Department (HOSPITAL_COMMUNITY)
Admission: EM | Admit: 2017-12-15 | Discharge: 2017-12-15 | Disposition: A | Discharge status: Home / Self Care | Payer: Medicare HMO | Attending: Emergency Medicine | Admitting: Emergency Medicine

## 2017-12-15 DIAGNOSIS — I10 Essential (primary) hypertension: Secondary | ICD-10-CM | POA: Insufficient documentation

## 2017-12-15 DIAGNOSIS — R1032 Left lower quadrant pain: Secondary | ICD-10-CM | POA: Diagnosis present

## 2017-12-15 DIAGNOSIS — Z79899 Other long term (current) drug therapy: Secondary | ICD-10-CM | POA: Insufficient documentation

## 2017-12-15 DIAGNOSIS — Z7984 Long term (current) use of oral hypoglycemic drugs: Secondary | ICD-10-CM | POA: Insufficient documentation

## 2017-12-15 DIAGNOSIS — E039 Hypothyroidism, unspecified: Secondary | ICD-10-CM | POA: Insufficient documentation

## 2017-12-15 DIAGNOSIS — E119 Type 2 diabetes mellitus without complications: Secondary | ICD-10-CM | POA: Insufficient documentation

## 2017-12-15 DIAGNOSIS — K5792 Diverticulitis of intestine, part unspecified, without perforation or abscess without bleeding: Secondary | ICD-10-CM | POA: Insufficient documentation

## 2017-12-15 LAB — COMPREHENSIVE METABOLIC PANEL
ALT: 21 U/L (ref 14–54)
ANION GAP: 10 (ref 5–15)
AST: 25 U/L (ref 15–41)
Albumin: 4.3 g/dL (ref 3.5–5.0)
Alkaline Phosphatase: 29 U/L — ABNORMAL LOW (ref 38–126)
BILIRUBIN TOTAL: 0.5 mg/dL (ref 0.3–1.2)
BUN: 9 mg/dL (ref 6–20)
CALCIUM: 9 mg/dL (ref 8.9–10.3)
CO2: 25 mmol/L (ref 22–32)
Chloride: 103 mmol/L (ref 101–111)
Creatinine, Ser: 0.93 mg/dL (ref 0.44–1.00)
GFR calc Af Amer: >60 mL/min (ref >60)
Glucose, Bld: 234 mg/dL — ABNORMAL HIGH (ref 65–99)
POTASSIUM: 4.1 mmol/L (ref 3.5–5.1)
Sodium: 138 mmol/L (ref 135–145)
Total Protein: 8 g/dL (ref 6.5–8.1)

## 2017-12-15 LAB — URINALYSIS, ROUTINE W REFLEX MICROSCOPIC
BILIRUBIN URINE: NEGATIVE
GLUCOSE, UA: NEGATIVE mg/dL
Hgb urine dipstick: NEGATIVE
KETONES UR: NEGATIVE mg/dL
Nitrite: NEGATIVE
PH: 5 (ref 5.0–8.0)
PROTEIN: NEGATIVE mg/dL
Specific Gravity, Urine: 1.008 (ref 1.005–1.030)

## 2017-12-15 LAB — CBC WITH DIFFERENTIAL/PLATELET
Basophils Absolute: 0 10*3/uL (ref 0.0–0.1)
Basophils Relative: 0 %
Eosinophils Absolute: 0.2 10*3/uL (ref 0.0–0.7)
Eosinophils Relative: 2 %
HEMATOCRIT: 39.6 % (ref 36.0–46.0)
Hemoglobin: 13.3 g/dL (ref 12.0–15.0)
LYMPHS ABS: 3.5 10*3/uL (ref 0.7–4.0)
LYMPHS PCT: 26 %
MCH: 29.1 pg (ref 26.0–34.0)
MCHC: 33.6 g/dL (ref 30.0–36.0)
MCV: 86.7 fL (ref 78.0–100.0)
MONO ABS: 1.2 10*3/uL — AB (ref 0.1–1.0)
MONOS PCT: 9 %
NEUTROS ABS: 8.6 10*3/uL — AB (ref 1.7–7.7)
Neutrophils Relative %: 63 %
Platelets: 304 10*3/uL (ref 150–400)
RBC: 4.57 MIL/uL (ref 3.87–5.11)
RDW: 12.5 % (ref 11.5–15.5)
WBC: 13.6 10*3/uL — ABNORMAL HIGH (ref 4.0–10.5)

## 2017-12-15 LAB — LIPASE, BLOOD: LIPASE: 43 U/L (ref 11–51)

## 2017-12-15 MED ORDER — IOPAMIDOL (ISOVUE-300) INJECTION 61%
100.0000 mL | Freq: Once | INTRAVENOUS | Status: AC | PRN
  Administered 2017-12-15: 100 mL via INTRAVENOUS

## 2017-12-15 MED ORDER — HYDROCODONE-ACETAMINOPHEN 5-325 MG PO TABS: 1.0000 | ORAL_TABLET | Freq: Four times a day (QID) | ORAL | 0 refills | Status: DC | PRN

## 2017-12-15 MED ORDER — METRONIDAZOLE 500 MG PO TABS
500.0000 mg | ORAL_TABLET | Freq: Once | ORAL | Status: AC
  Administered 2017-12-15: 500 mg via ORAL
  Filled 2017-12-15: qty 1

## 2017-12-15 MED ORDER — CIPROFLOXACIN HCL 250 MG PO TABS
500.0000 mg | ORAL_TABLET | Freq: Once | ORAL | Status: AC
  Administered 2017-12-15: 500 mg via ORAL
  Filled 2017-12-15: qty 2

## 2017-12-15 MED ORDER — SODIUM CHLORIDE 0.9 % IV SOLN
INTRAVENOUS | Status: DC
  Administered 2017-12-15: 21:00:00 via INTRAVENOUS

## 2017-12-15 MED ORDER — METRONIDAZOLE 500 MG PO TABS: 500.0000 mg | ORAL_TABLET | Freq: Three times a day (TID) | ORAL | 0 refills | Status: AC

## 2017-12-15 MED ORDER — CIPROFLOXACIN HCL 500 MG PO TABS: 500.0000 mg | ORAL_TABLET | Freq: Two times a day (BID) | ORAL | 0 refills | Status: DC

## 2017-12-15 NOTE — ED Notes (Signed)
Patient transported to CT 

## 2017-12-15 NOTE — Discharge Instructions (Addendum)
Take the antibiotic Cipro and the antibiotic Flagyl as directed for the next 7 days.  Would expect improvement over the next few days.  Return for any new or worse symptoms.  Make an appointment to follow-up with gastroenterology.  Information provided above.  Take pain medicine as directed.  The antibiotic Cipro can make your diabetic pills work better than usual so follow your blood sugars carefully.

## 2017-12-15 NOTE — ED Notes (Signed)
Patient's family at desk asking if patient can have food or drink. Stated to patient and family that until she is seen by EDP she can not.

## 2017-12-15 NOTE — ED Provider Notes (Signed)
[facility] EMERGENCY DEPARTMENT Provider Note   CSN: [id number] Arrival date & time: 12/15/17  1833     History   Chief Complaint Chief Complaint  Patient presents with  . Abdominal Pain    HPI Sherry Fields is a 62 y.o. female.  Patient presents with a complaint of left lower quadrant abdominal pain.  Does radiate across the lower part of the abdomen.  Patient feels that her urination has been less than usual.  Had some concerns about urinary tract infection.  Patient without a history of kidney stones.  She awoke with the left lower quadrant pain at 8:00 this morning.  Is been constant.  No nausea or vomiting no fevers no diarrhea just decreased urine output.     Past Medical History:  Diagnosis Date  . Anxiety   . Depression   . Diabetes mellitus   . Hyperlipidemia   . Hypertension   . Hypothyroid   . Insomnia     Patient Active Problem List   Diagnosis Date Noted  . Diabetes mellitus   . Hypertension   . Hypothyroid   . Insomnia   . Hyperlipidemia     Past Surgical History:  Procedure Laterality Date  . CHOLECYSTECTOMY N/A 10/17/2016   Procedure: LAPAROSCOPIC CHOLECYSTECTOMY  (no gram);  Surgeon: Jules Husbands, MD;  Location: [facility] ORS;  Service: General;  Laterality: N/A;  . THYROIDECTOMY       OB History   None      Home Medications    Prior to Admission medications   Medication Sig Start Date End Date Taking? Authorizing Provider  glipiZIDE (GLUCOTROL XL) 10 MG 24 hr tablet Take 10 mg by mouth every evening. 11/02/17  Yes [provider]  levothyroxine (SYNTHROID, LEVOTHROID) 50 MCG tablet Take 50 mcg by mouth at bedtime.    Yes [provider]  losartan (COZAAR) 50 MG tablet Take 50 mg by mouth at bedtime.     Yes [provider]  metFORMIN (GLUCOPHAGE) 1000 MG tablet Take 1,000 mg by mouth 2 (two) times daily.   Yes [provider]  metoprolol (TOPROL-XL) 50 MG 24 hr tablet Take 50 mg by mouth at bedtime.      Yes [provider]  pravastatin (PRAVACHOL) 20 MG tablet Take 20 mg by mouth at bedtime.     Yes [provider]  sitaGLIPtin (JANUVIA) 100 MG tablet Take 100 mg by mouth at bedtime.    Yes [provider]  zolpidem (AMBIEN) 10 MG tablet Take 10 mg by mouth at bedtime. For sleep   Yes [provider]  ciprofloxacin (CIPRO) 500 MG tablet Take 1 tablet (500 mg total) by mouth every 12 (twelve) hours. 12/15/17   Fredia Sorrow, MD  EPINEPHrine (EPIPEN) 0.3 mg/0.3 mL DEVI Inject 0.3 mg into the muscle once.      [provider]  HYDROcodone-acetaminophen (NORCO/VICODIN) 5-325 MG tablet Take 1-2 tablets by mouth every 6 (six) hours as needed. 12/15/17   Fredia Sorrow, MD  metroNIDAZOLE (FLAGYL) 500 MG tablet Take 1 tablet (500 mg total) by mouth 3 (three) times daily for 7 days. 12/15/17 12/22/17  Fredia Sorrow, MD    Family History Family History  Problem Relation Age of Onset  . Coronary artery disease Mother   . Coronary artery disease Father     Social History Social History   Tobacco Use  . Smoking status: Never Smoker  . Smokeless tobacco: Current User    Types: Snuff  Substance Use Topics  . Alcohol use: No  . Drug use: No     Allergies   Naproxen and Poison sumac extract   Review of Systems Review of Systems  Constitutional: Negative for fever.  HENT: Negative for congestion.   Eyes: Negative for redness and visual disturbance.  Respiratory: Negative for shortness of breath.   Cardiovascular: Negative for chest pain.  Gastrointestinal: Positive for abdominal pain. Negative for diarrhea, nausea and vomiting.  Genitourinary: Positive for difficulty urinating. Negative for dysuria.  Musculoskeletal: Negative for back pain.  Skin: Negative for rash.  Neurological: Negative for dizziness, syncope and headaches.  Hematological: Does not bruise/bleed easily.  Psychiatric/Behavioral: Negative for confusion.     Physical  Exam Updated Vital Signs BP (!) 132/93 (BP Location: Left Arm)   Pulse 75   Temp 98.2 F (36.8 C) (Oral)   Resp 16   Ht 1.6 m (5\' 3" )   Wt 73.5 kg (162 lb)   SpO2 99%   BMI 28.70 kg/m   Physical Exam  Constitutional: She is oriented to person, place, and time. She appears well-developed and well-nourished. No distress.  HENT:  Head: Normocephalic and atraumatic.  Mouth/Throat: Oropharynx is clear and moist.  Eyes: Pupils are equal, round, and reactive to light. Conjunctivae and EOM are normal.  Neck: Neck supple.  Cardiovascular: Normal rate, regular rhythm and normal heart sounds.  Pulmonary/Chest: Effort normal and breath sounds normal. No respiratory distress.  Abdominal: Soft. Bowel sounds are normal. There is tenderness. There is no guarding.  Musculoskeletal: Normal range of motion.  Neurological: She is alert and oriented to person, place, and time. No cranial nerve deficit or sensory deficit. She exhibits normal muscle tone. Coordination normal.  Skin: Skin is warm. No rash noted.  Nursing note and vitals reviewed.    ED Treatments / Results  Labs (all labs ordered are listed, but only abnormal results are displayed) Labs Reviewed  URINALYSIS, ROUTINE W REFLEX MICROSCOPIC - Abnormal; Notable for the following components:      Result Value   Color, Urine STRAW (*)    Leukocytes, UA MODERATE (*)    Bacteria, UA RARE (*)    All other components within normal limits  CBC WITH DIFFERENTIAL/PLATELET - Abnormal; Notable for the following components:   WBC 13.6 (*)    Neutro Abs 8.6 (*)    Monocytes Absolute 1.2 (*)    All other components within normal limits  COMPREHENSIVE METABOLIC PANEL - Abnormal; Notable for the following components:   Glucose, Bld 234 (*)    Alkaline Phosphatase 29 (*)    All other components within normal limits  LIPASE, BLOOD    EKG None  Radiology Ct Abdomen Pelvis W Contrast  Result Date: 12/15/2017 CLINICAL DATA:  With  intermittent sharp LEFT lower quadrant pain. EXAM: CT ABDOMEN AND PELVIS WITH CONTRAST TECHNIQUE: Multidetector CT imaging of the abdomen and pelvis was performed using the standard protocol following bolus administration of intravenous contrast. CONTRAST:  168mL ISOVUE-300 IOPAMIDOL (ISOVUE-300) INJECTION 61% COMPARISON:  CT 10/16/2016 FINDINGS: Lower chest: Lung bases are clear. Hepatobiliary: No focal hepatic lesion. Postcholecystectomy. No biliary dilatation. Pancreas: Pancreas is normal. No ductal dilatation. No pancreatic inflammation. Spleen: Normal spleen Adrenals/urinary tract: Adrenal glands and kidneys are normal. The ureters and bladder normal. Stomach/Bowel: Stomach, small bowel, appendix, and cecum are normal. Diverticular the descending colon. Pericolonic inflammation surrounds the mid descending colon through region of diverticulosis. Findings are most consistent with acute diverticulitis. No perforation or abscess (images 58-70  of series 3). Rectosigmoid colon normal. Vascular/Lymphatic: Abdominal aorta is normal caliber. No periportal or retroperitoneal adenopathy. No pelvic adenopathy. Reproductive: Calcified fibroid in the uterine fundus. The LEFT ovary is lobular, solid-appearing and has a central calcification but is not changed in size from comparison exam 1 year prior measuring 35 x 24 mm compared to 34 by 25 mm lower lobe scan from 2011 has similar appearance. Other: No free fluid. Musculoskeletal: No aggressive osseous lesion. IMPRESSION: 1. Mild acute diverticulitis of the descending colon. No abscess or perforation. 2. Calcified fibroid uterus. Stable solid LEFT ovary compared to 2011. Electronically Signed   By: Suzy Bouchard M.D.   On: 12/15/2017 21:30    Procedures Procedures (including critical care time)  Medications Ordered in ED Medications  0.9 %  sodium chloride infusion ( Intravenous New Bag/Given 12/15/17 2035)  ciprofloxacin (CIPRO) tablet 500 mg (has no  administration in time range)  metroNIDAZOLE (FLAGYL) tablet 500 mg (has no administration in time range)  iopamidol (ISOVUE-300) 61 % injection 100 mL (100 mLs Intravenous Contrast Given 12/15/17 2108)     Initial Impression / Assessment and Plan / ED Course  I have reviewed the triage vital signs and the nursing notes.  Pertinent labs & imaging results that were available during my care of the patient were reviewed by me and considered in my medical decision making (see chart for details).     Patient CT scan consistent with diverticulitis without any complicating factors.  Patient's urinalysis also with a fair number of white blood cells.  Urinary tract infection is possible.  Urine culture sent Cipro should help treat that.  Patient is on oral hypoglycemic so she will need to watch her blood sugars closely.  But her tendency seems to be that they are on the high side so she will probably be fine taking the Cipro.  Patient also given referral to GI medicine.  Patient will return for any new or worse symptoms.  Final Clinical Impressions(s) / ED Diagnoses   Final diagnoses:  Diverticulitis    ED Discharge Orders        Ordered    HYDROcodone-acetaminophen (NORCO/VICODIN) 5-325 MG tablet  Every 6 hours PRN     12/15/17 2240    ciprofloxacin (CIPRO) 500 MG tablet  Every 12 hours     12/15/17 2242    metroNIDAZOLE (FLAGYL) 500 MG tablet  3 times daily     12/15/17 2242       Fredia Sorrow, MD 12/16/17 0003

## 2017-12-15 NOTE — ED Triage Notes (Signed)
LLQ pain that radiates across lower abd, reports when she urinates its less than usual

## 2017-12-17 LAB — URINE CULTURE

## 2018-08-01 HISTORY — PX: BREAST BIOPSY: SHX20

## 2018-09-03 ENCOUNTER — Other Ambulatory Visit (HOSPITAL_COMMUNITY): Payer: Self-pay

## 2018-09-03 DIAGNOSIS — Z1231 Encounter for screening mammogram for malignant neoplasm of breast: Secondary | ICD-10-CM

## 2018-09-10 ENCOUNTER — Ambulatory Visit (HOSPITAL_COMMUNITY): Payer: Medicare Other

## 2018-09-13 ENCOUNTER — Ambulatory Visit (HOSPITAL_COMMUNITY): Payer: Medicare Other

## 2019-03-06 DIAGNOSIS — E1165 Type 2 diabetes mellitus with hyperglycemia: Secondary | ICD-10-CM | POA: Diagnosis not present

## 2019-03-06 DIAGNOSIS — E782 Mixed hyperlipidemia: Secondary | ICD-10-CM | POA: Diagnosis not present

## 2019-03-06 DIAGNOSIS — M2550 Pain in unspecified joint: Secondary | ICD-10-CM | POA: Diagnosis not present

## 2019-03-06 DIAGNOSIS — E039 Hypothyroidism, unspecified: Secondary | ICD-10-CM | POA: Diagnosis not present

## 2019-03-06 DIAGNOSIS — G4700 Insomnia, unspecified: Secondary | ICD-10-CM | POA: Diagnosis not present

## 2019-03-06 DIAGNOSIS — I10 Essential (primary) hypertension: Secondary | ICD-10-CM | POA: Diagnosis not present

## 2019-03-06 DIAGNOSIS — R3911 Hesitancy of micturition: Secondary | ICD-10-CM | POA: Diagnosis not present

## 2019-03-07 DIAGNOSIS — E1165 Type 2 diabetes mellitus with hyperglycemia: Secondary | ICD-10-CM | POA: Diagnosis not present

## 2019-03-07 DIAGNOSIS — E119 Type 2 diabetes mellitus without complications: Secondary | ICD-10-CM | POA: Diagnosis not present

## 2019-03-07 DIAGNOSIS — E782 Mixed hyperlipidemia: Secondary | ICD-10-CM | POA: Diagnosis not present

## 2019-03-07 DIAGNOSIS — I10 Essential (primary) hypertension: Secondary | ICD-10-CM | POA: Diagnosis not present

## 2019-03-07 DIAGNOSIS — E039 Hypothyroidism, unspecified: Secondary | ICD-10-CM | POA: Diagnosis not present

## 2019-03-13 DIAGNOSIS — E1165 Type 2 diabetes mellitus with hyperglycemia: Secondary | ICD-10-CM | POA: Diagnosis not present

## 2019-03-13 DIAGNOSIS — G4700 Insomnia, unspecified: Secondary | ICD-10-CM | POA: Diagnosis not present

## 2019-03-13 DIAGNOSIS — I10 Essential (primary) hypertension: Secondary | ICD-10-CM | POA: Diagnosis not present

## 2019-03-13 DIAGNOSIS — E039 Hypothyroidism, unspecified: Secondary | ICD-10-CM | POA: Diagnosis not present

## 2019-03-13 DIAGNOSIS — E782 Mixed hyperlipidemia: Secondary | ICD-10-CM | POA: Diagnosis not present

## 2019-03-19 ENCOUNTER — Other Ambulatory Visit (HOSPITAL_COMMUNITY): Payer: Self-pay | Admitting: Internal Medicine

## 2019-03-19 DIAGNOSIS — Z1231 Encounter for screening mammogram for malignant neoplasm of breast: Secondary | ICD-10-CM

## 2019-03-21 ENCOUNTER — Inpatient Hospital Stay (HOSPITAL_COMMUNITY): Admission: RE | Admit: 2019-03-21 | Payer: Medicare Other | Source: Ambulatory Visit

## 2019-03-27 ENCOUNTER — Ambulatory Visit (HOSPITAL_COMMUNITY)
Admission: RE | Admit: 2019-03-27 | Discharge: 2019-03-27 | Disposition: A | Discharge status: Home / Self Care | Payer: Medicare HMO | Source: Ambulatory Visit | Attending: Internal Medicine | Admitting: Internal Medicine

## 2019-03-27 ENCOUNTER — Other Ambulatory Visit: Payer: Self-pay

## 2019-03-27 DIAGNOSIS — Z1231 Encounter for screening mammogram for malignant neoplasm of breast: Secondary | ICD-10-CM | POA: Insufficient documentation

## 2019-03-29 ENCOUNTER — Other Ambulatory Visit (HOSPITAL_COMMUNITY): Payer: Self-pay | Admitting: Internal Medicine

## 2019-03-29 DIAGNOSIS — R928 Other abnormal and inconclusive findings on diagnostic imaging of breast: Secondary | ICD-10-CM

## 2019-04-01 ENCOUNTER — Other Ambulatory Visit (HOSPITAL_COMMUNITY): Payer: Self-pay | Admitting: Internal Medicine

## 2019-04-01 DIAGNOSIS — R928 Other abnormal and inconclusive findings on diagnostic imaging of breast: Secondary | ICD-10-CM

## 2019-04-02 DIAGNOSIS — E782 Mixed hyperlipidemia: Secondary | ICD-10-CM | POA: Diagnosis not present

## 2019-04-02 DIAGNOSIS — E1165 Type 2 diabetes mellitus with hyperglycemia: Secondary | ICD-10-CM | POA: Diagnosis not present

## 2019-04-02 DIAGNOSIS — E039 Hypothyroidism, unspecified: Secondary | ICD-10-CM | POA: Diagnosis not present

## 2019-04-02 DIAGNOSIS — G4700 Insomnia, unspecified: Secondary | ICD-10-CM | POA: Diagnosis not present

## 2019-04-02 DIAGNOSIS — I10 Essential (primary) hypertension: Secondary | ICD-10-CM | POA: Diagnosis not present

## 2019-04-10 DIAGNOSIS — E1165 Type 2 diabetes mellitus with hyperglycemia: Secondary | ICD-10-CM | POA: Diagnosis not present

## 2019-04-10 DIAGNOSIS — E039 Hypothyroidism, unspecified: Secondary | ICD-10-CM | POA: Diagnosis not present

## 2019-04-10 DIAGNOSIS — G4700 Insomnia, unspecified: Secondary | ICD-10-CM | POA: Diagnosis not present

## 2019-04-10 DIAGNOSIS — Z2821 Immunization not carried out because of patient refusal: Secondary | ICD-10-CM | POA: Diagnosis not present

## 2019-04-10 DIAGNOSIS — I10 Essential (primary) hypertension: Secondary | ICD-10-CM | POA: Diagnosis not present

## 2019-04-16 ENCOUNTER — Encounter (HOSPITAL_COMMUNITY): Payer: Self-pay

## 2019-04-16 ENCOUNTER — Ambulatory Visit (HOSPITAL_COMMUNITY): Admission: RE | Admit: 2019-04-16 | Payer: Medicare HMO | Source: Ambulatory Visit

## 2019-04-16 ENCOUNTER — Encounter (HOSPITAL_COMMUNITY): Payer: Medicare Other

## 2019-04-16 DIAGNOSIS — Z1211 Encounter for screening for malignant neoplasm of colon: Secondary | ICD-10-CM | POA: Diagnosis not present

## 2019-04-16 DIAGNOSIS — Z1212 Encounter for screening for malignant neoplasm of rectum: Secondary | ICD-10-CM | POA: Diagnosis not present

## 2019-04-23 ENCOUNTER — Encounter (HOSPITAL_COMMUNITY): Payer: Medicare Other

## 2019-04-23 ENCOUNTER — Encounter (HOSPITAL_COMMUNITY): Payer: Self-pay

## 2019-04-23 ENCOUNTER — Ambulatory Visit (HOSPITAL_COMMUNITY): Payer: Medicare HMO

## 2019-04-25 ENCOUNTER — Ambulatory Visit
Admission: RE | Admit: 2019-04-25 | Discharge: 2019-04-25 | Disposition: A | Discharge status: Home / Self Care | Payer: Medicare HMO | Source: Ambulatory Visit | Attending: Internal Medicine | Admitting: Internal Medicine

## 2019-04-25 ENCOUNTER — Other Ambulatory Visit: Payer: Self-pay | Admitting: Internal Medicine

## 2019-04-25 ENCOUNTER — Other Ambulatory Visit: Payer: Medicare Other

## 2019-04-25 DIAGNOSIS — R928 Other abnormal and inconclusive findings on diagnostic imaging of breast: Secondary | ICD-10-CM

## 2019-04-25 DIAGNOSIS — N6310 Unspecified lump in the right breast, unspecified quadrant: Secondary | ICD-10-CM

## 2019-04-25 DIAGNOSIS — R922 Inconclusive mammogram: Secondary | ICD-10-CM | POA: Diagnosis not present

## 2019-04-25 DIAGNOSIS — N6313 Unspecified lump in the right breast, lower outer quadrant: Secondary | ICD-10-CM | POA: Diagnosis not present

## 2019-04-25 DIAGNOSIS — N6311 Unspecified lump in the right breast, upper outer quadrant: Secondary | ICD-10-CM | POA: Diagnosis not present

## 2019-05-02 ENCOUNTER — Other Ambulatory Visit: Payer: Self-pay | Admitting: Internal Medicine

## 2019-05-29 ENCOUNTER — Ambulatory Visit
Admission: RE | Admit: 2019-05-29 | Discharge: 2019-05-29 | Disposition: A | Discharge status: Home / Self Care | Payer: Medicare HMO | Source: Ambulatory Visit | Attending: Internal Medicine | Admitting: Internal Medicine

## 2019-05-29 DIAGNOSIS — N6310 Unspecified lump in the right breast, unspecified quadrant: Secondary | ICD-10-CM

## 2019-05-29 DIAGNOSIS — D241 Benign neoplasm of right breast: Secondary | ICD-10-CM | POA: Diagnosis not present

## 2019-05-29 DIAGNOSIS — R928 Other abnormal and inconclusive findings on diagnostic imaging of breast: Secondary | ICD-10-CM | POA: Insufficient documentation

## 2019-05-29 DIAGNOSIS — N6315 Unspecified lump in the right breast, overlapping quadrants: Secondary | ICD-10-CM | POA: Diagnosis not present

## 2019-05-30 LAB — SURGICAL PATHOLOGY

## 2019-07-04 DIAGNOSIS — E039 Hypothyroidism, unspecified: Secondary | ICD-10-CM | POA: Diagnosis not present

## 2019-07-04 DIAGNOSIS — E1165 Type 2 diabetes mellitus with hyperglycemia: Secondary | ICD-10-CM | POA: Diagnosis not present

## 2019-07-04 DIAGNOSIS — E119 Type 2 diabetes mellitus without complications: Secondary | ICD-10-CM | POA: Diagnosis not present

## 2019-07-04 DIAGNOSIS — I10 Essential (primary) hypertension: Secondary | ICD-10-CM | POA: Diagnosis not present

## 2019-07-04 DIAGNOSIS — E782 Mixed hyperlipidemia: Secondary | ICD-10-CM | POA: Diagnosis not present

## 2019-07-08 DIAGNOSIS — G4700 Insomnia, unspecified: Secondary | ICD-10-CM | POA: Diagnosis not present

## 2019-07-08 DIAGNOSIS — E782 Mixed hyperlipidemia: Secondary | ICD-10-CM | POA: Diagnosis not present

## 2019-07-08 DIAGNOSIS — N6002 Solitary cyst of left breast: Secondary | ICD-10-CM | POA: Diagnosis not present

## 2019-07-08 DIAGNOSIS — D72828 Other elevated white blood cell count: Secondary | ICD-10-CM | POA: Diagnosis not present

## 2019-07-08 DIAGNOSIS — E1165 Type 2 diabetes mellitus with hyperglycemia: Secondary | ICD-10-CM | POA: Diagnosis not present

## 2019-07-08 DIAGNOSIS — I10 Essential (primary) hypertension: Secondary | ICD-10-CM | POA: Diagnosis not present

## 2019-07-08 DIAGNOSIS — E039 Hypothyroidism, unspecified: Secondary | ICD-10-CM | POA: Diagnosis not present

## 2019-07-08 DIAGNOSIS — L989 Disorder of the skin and subcutaneous tissue, unspecified: Secondary | ICD-10-CM | POA: Diagnosis not present

## 2019-08-05 DIAGNOSIS — E1165 Type 2 diabetes mellitus with hyperglycemia: Secondary | ICD-10-CM | POA: Diagnosis not present

## 2019-08-05 DIAGNOSIS — I10 Essential (primary) hypertension: Secondary | ICD-10-CM | POA: Diagnosis not present

## 2019-08-05 DIAGNOSIS — E039 Hypothyroidism, unspecified: Secondary | ICD-10-CM | POA: Diagnosis not present

## 2019-08-05 DIAGNOSIS — E782 Mixed hyperlipidemia: Secondary | ICD-10-CM | POA: Diagnosis not present

## 2019-08-05 DIAGNOSIS — G4700 Insomnia, unspecified: Secondary | ICD-10-CM | POA: Diagnosis not present

## 2019-09-30 DIAGNOSIS — I10 Essential (primary) hypertension: Secondary | ICD-10-CM | POA: Diagnosis not present

## 2019-09-30 DIAGNOSIS — E1165 Type 2 diabetes mellitus with hyperglycemia: Secondary | ICD-10-CM | POA: Diagnosis not present

## 2019-09-30 DIAGNOSIS — G4700 Insomnia, unspecified: Secondary | ICD-10-CM | POA: Diagnosis not present

## 2019-09-30 DIAGNOSIS — E782 Mixed hyperlipidemia: Secondary | ICD-10-CM | POA: Diagnosis not present

## 2019-10-14 DIAGNOSIS — Z0000 Encounter for general adult medical examination without abnormal findings: Secondary | ICD-10-CM | POA: Diagnosis not present

## 2019-10-14 DIAGNOSIS — N6002 Solitary cyst of left breast: Secondary | ICD-10-CM | POA: Diagnosis not present

## 2019-10-14 DIAGNOSIS — E782 Mixed hyperlipidemia: Secondary | ICD-10-CM | POA: Diagnosis not present

## 2019-10-14 DIAGNOSIS — E1165 Type 2 diabetes mellitus with hyperglycemia: Secondary | ICD-10-CM | POA: Diagnosis not present

## 2019-10-14 DIAGNOSIS — I10 Essential (primary) hypertension: Secondary | ICD-10-CM | POA: Diagnosis not present

## 2019-11-07 DIAGNOSIS — G4700 Insomnia, unspecified: Secondary | ICD-10-CM | POA: Diagnosis not present

## 2019-11-07 DIAGNOSIS — I10 Essential (primary) hypertension: Secondary | ICD-10-CM | POA: Diagnosis not present

## 2019-11-07 DIAGNOSIS — E1165 Type 2 diabetes mellitus with hyperglycemia: Secondary | ICD-10-CM | POA: Diagnosis not present

## 2019-11-07 DIAGNOSIS — Z0001 Encounter for general adult medical examination with abnormal findings: Secondary | ICD-10-CM | POA: Diagnosis not present

## 2019-11-07 DIAGNOSIS — E782 Mixed hyperlipidemia: Secondary | ICD-10-CM | POA: Diagnosis not present

## 2019-11-07 DIAGNOSIS — E039 Hypothyroidism, unspecified: Secondary | ICD-10-CM | POA: Diagnosis not present

## 2019-11-15 IMAGING — MG MM DIGITAL DIAGNOSTIC UNILAT*R* W/ TOMO W/ CAD
6 series · 6 of 18 positions shown · non-contrast
Comparison: Previous exam(s).

CLINICAL DATA: Screening recall for possible right breast mass.

EXAM:
DIGITAL DIAGNOSTIC UNILATERAL RIGHT MAMMOGRAM WITH CAD AND TOMO

[R MLO synth-2D (1 of 2)]
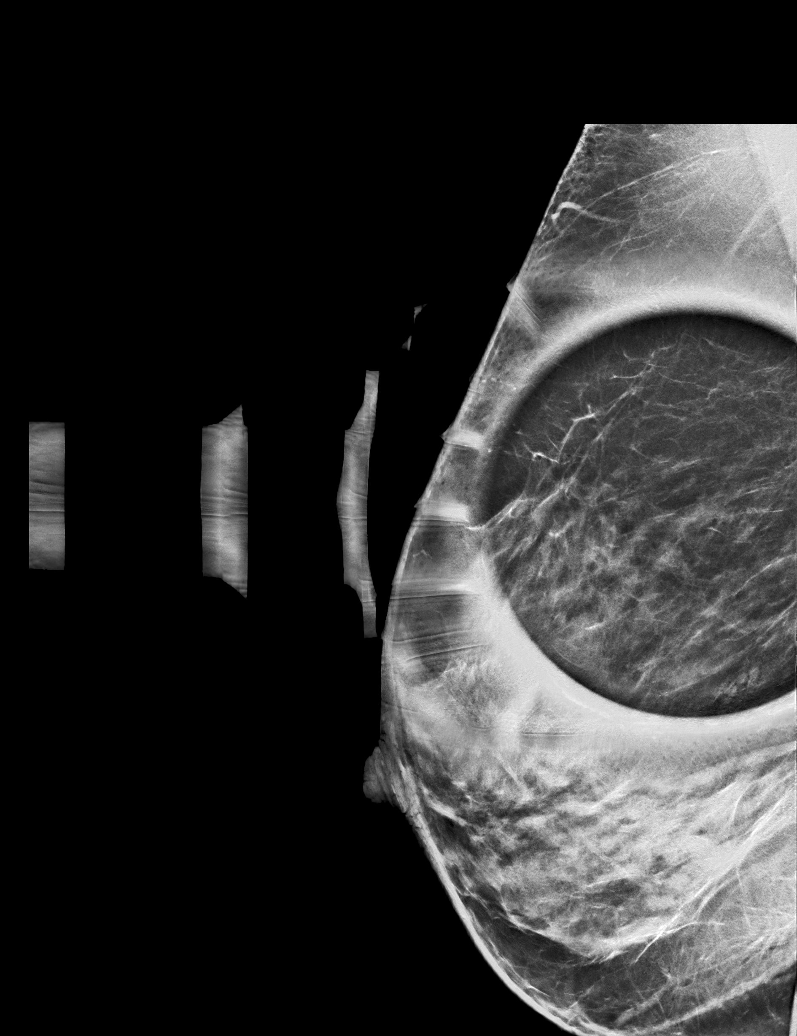

[R MLO synth-2D (2 of 2)]
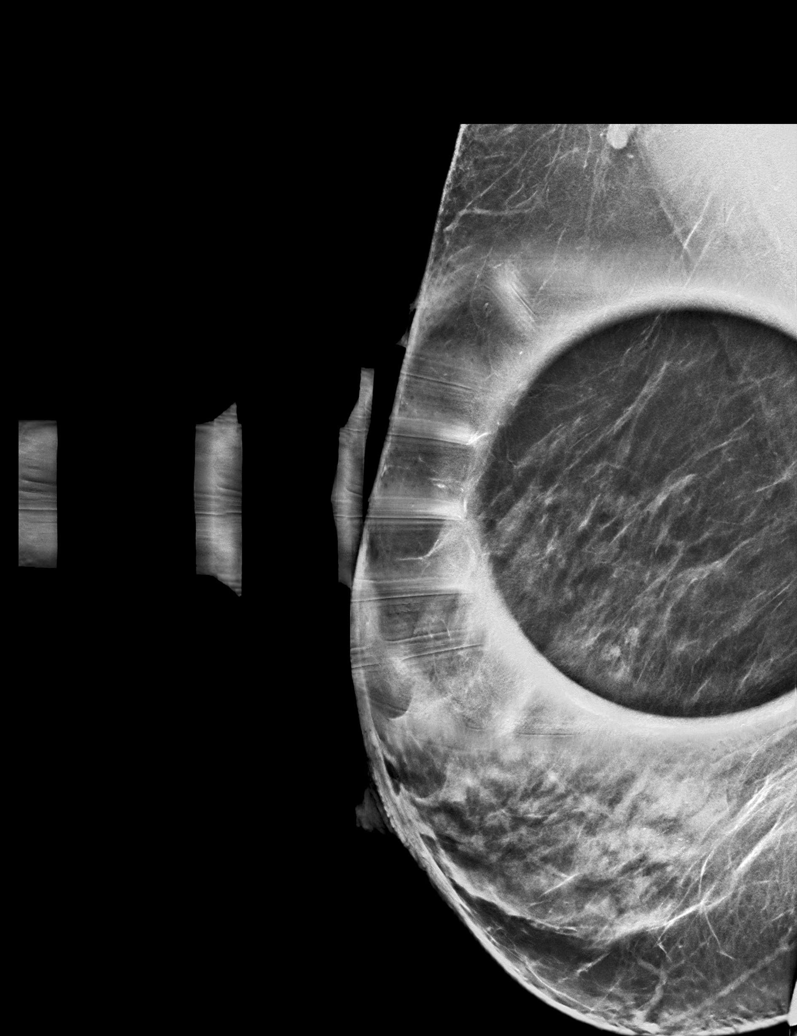

[R CC synth-2D]
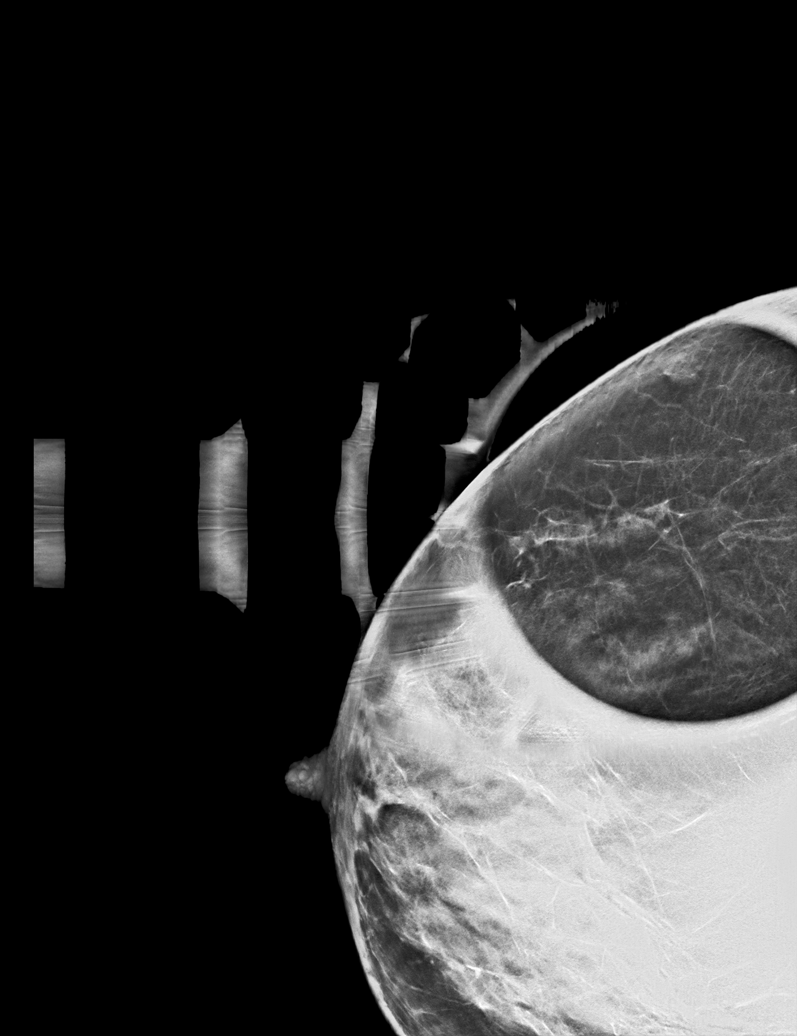

[R CC tomo · tomo slice 27/54.0]
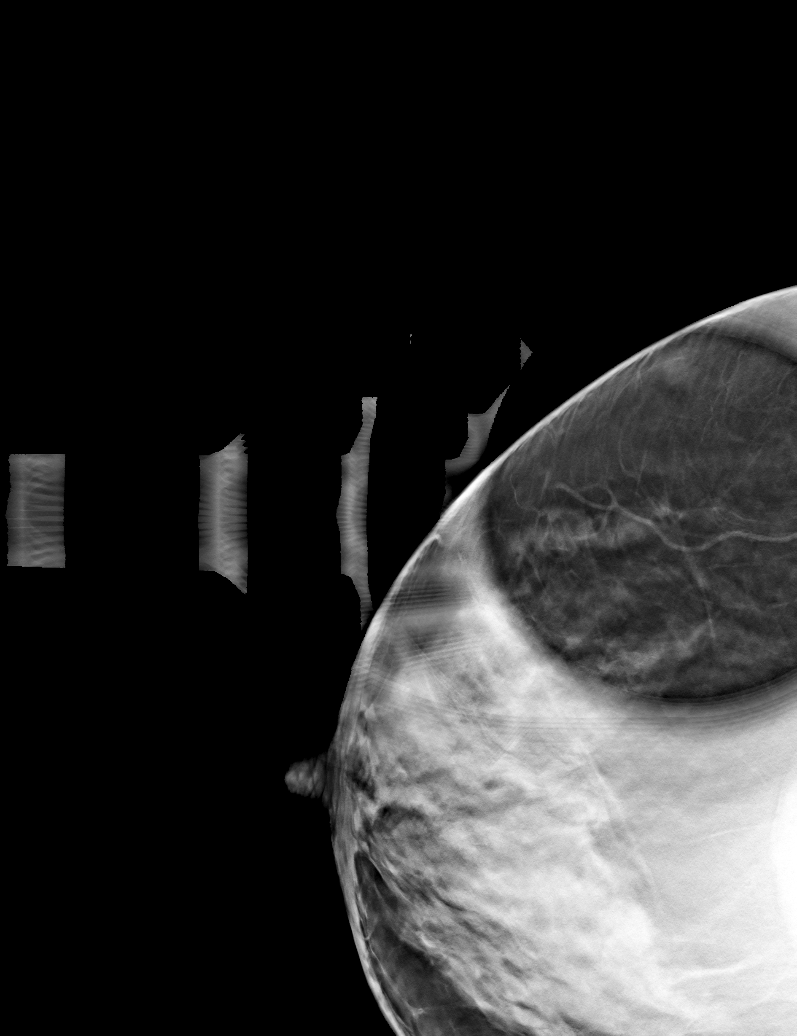

[R MLO tomo (1 of 2) · tomo slice 27/54.0]
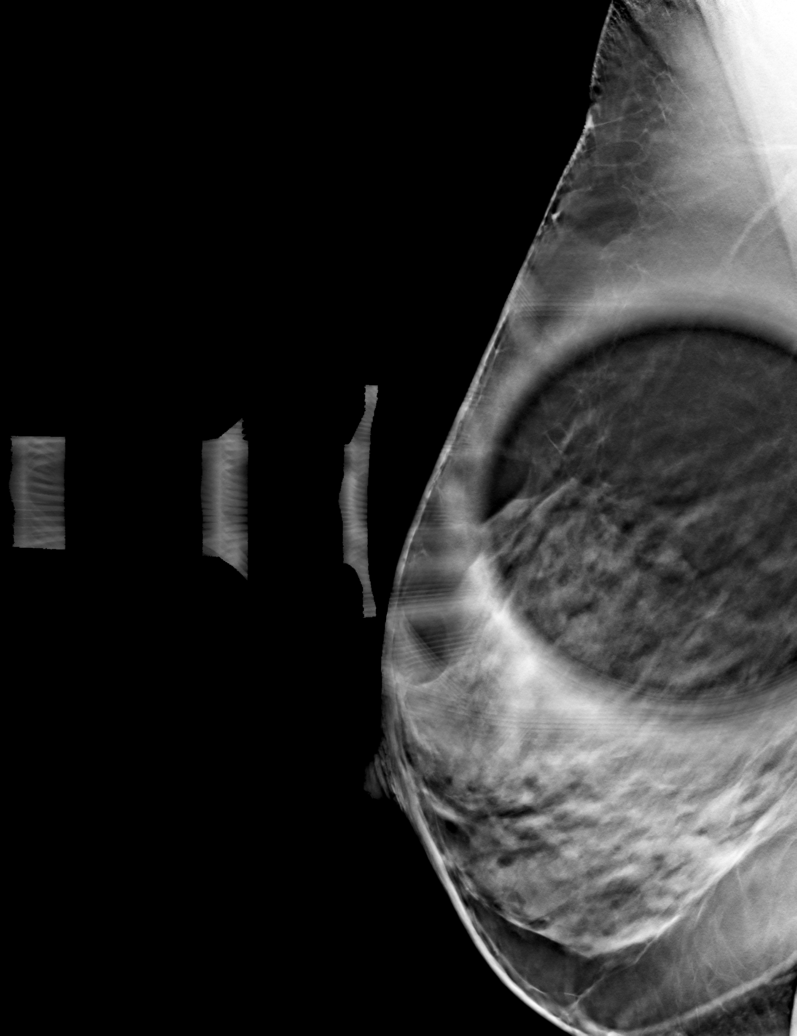

[R MLO tomo (2 of 2) · tomo slice 30/59.0]
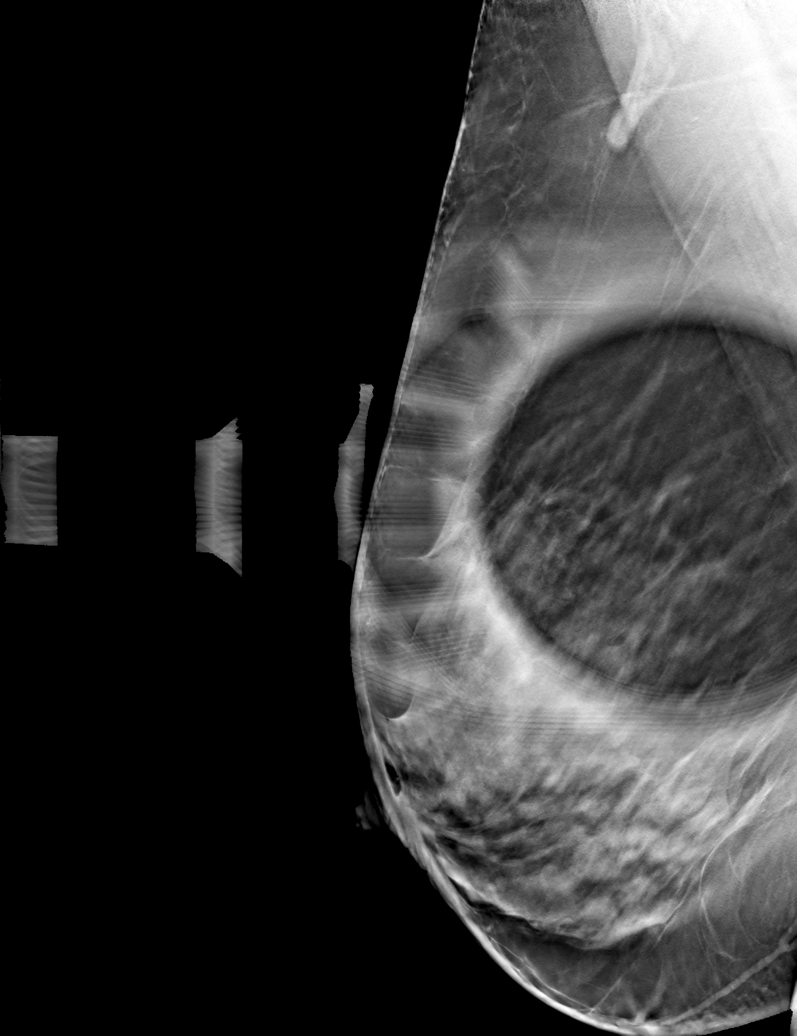

[6 of 18 positions shown; findings below may reference images not displayed]

ACR Breast Density Category c: The breast tissue is heterogeneously
dense, which may obscure small masses.
FINDINGS: Spot compression tomograms were performed of the outer right breast.
There is an oval mass in the outer right breast with slight margin
irregularity just beneath the skin surface measuring 0.7 cm.

Mammographic images were processed with CAD.

Targeted ultrasound of the outer right breast was performed. There
is an oval hyperechoic mass in the right breast just beneath the
skin surface at 9 o'clock 7 cm from nipple measuring 0.7 x 0.6 x
cm. This corresponds well with the mass seen in the outer right
breast at mammography. While this mass is not particularly
suspicious and may represent an angiomyolipoma given its imaging
features, it is a new finding and therefore tissue sampling is
warranted. No axillary lymphadenopathy.
IMPRESSION: Indeterminate mass in the right breast at the 9 o'clock position.

RECOMMENDATION:
Ultrasound-guided biopsy of the mass in the right breast at the 9
o'clock position is recommended. This will be scheduled for the
patient. The findings and recommendations were also discussed with
the patient's daughter, Ms. Amazigh.

I have discussed the findings and recommendations with the patient.
If applicable, a reminder letter will be sent to the patient
regarding the next appointment.

BI-RADS CATEGORY  4: Suspicious.

## 2019-12-12 DIAGNOSIS — E119 Type 2 diabetes mellitus without complications: Secondary | ICD-10-CM | POA: Diagnosis not present

## 2020-01-24 ENCOUNTER — Other Ambulatory Visit: Payer: Self-pay

## 2020-01-24 ENCOUNTER — Inpatient Hospital Stay: Admission: RE | Admit: 2020-01-24 | Payer: Self-pay | Source: Ambulatory Visit

## 2020-01-24 ENCOUNTER — Ambulatory Visit
Admission: EM | Admit: 2020-01-24 | Discharge: 2020-01-24 | Disposition: A | Discharge status: Home / Self Care | Payer: Medicare Other | Attending: Emergency Medicine | Admitting: Emergency Medicine

## 2020-01-24 DIAGNOSIS — L02219 Cutaneous abscess of trunk, unspecified: Secondary | ICD-10-CM | POA: Diagnosis not present

## 2020-01-24 DIAGNOSIS — L03319 Cellulitis of trunk, unspecified: Secondary | ICD-10-CM | POA: Diagnosis not present

## 2020-01-24 MED ORDER — DOXYCYCLINE HYCLATE 100 MG PO CAPS: 100.0000 mg | ORAL_CAPSULE | Freq: Two times a day (BID) | ORAL | 0 refills | Status: DC

## 2020-01-24 NOTE — ED Triage Notes (Signed)
Pt has red area that is sore on abdomen that came up 3 days ago,

## 2020-01-24 NOTE — Discharge Instructions (Signed)
Apply warm compresses 3-4x daily for 10-15 minutes Wash site daily with warm water and mild soap Keep covered to avoid friction Take antibiotic as prescribed and to completion Follow up here or with PCP if symptoms persists Return or go to the ED if you have any new or worsening symptoms increased redness, swelling, pain, nausea, vomiting, fever, chills, etc...  

## 2020-01-24 NOTE — ED Provider Notes (Signed)
[facility]   [id number] 01/24/20 Arrival Time: 2   CC: ABSCESS  SUBJECTIVE:  Sherry Fields is a 64 y.o. female who presents with a possible abscess of her RT lower abdomen x 3-4 days.  Denies precipitating event, trauma, or bug bite.  Denies alleviating factors.  Tender to the touch.  Denies previous symptoms in the past.  Denies fever, chills, nausea, vomiting, changes in bowel or bladder function.    ROS: As per HPI.  All other pertinent ROS negative.     Past Medical History:  Diagnosis Date  . Anxiety   . Depression   . Diabetes mellitus   . Hyperlipidemia   . Hypertension   . Hypothyroid   . Insomnia    Past Surgical History:  Procedure Laterality Date  . CHOLECYSTECTOMY N/A 10/17/2016   Procedure: LAPAROSCOPIC CHOLECYSTECTOMY  (no gram);  Surgeon: Jules Husbands, MD;  Location: [facility] ORS;  Service: General;  Laterality: N/A;  . THYROIDECTOMY     Allergies  Allergen Reactions  . Naproxen Anaphylaxis  . Poison Sumac Extract Hives and Rash   No current facility-administered medications on file prior to encounter.   Current Outpatient Medications on File Prior to Encounter  Medication Sig Dispense Refill  . EPINEPHrine (EPIPEN) 0.3 mg/0.3 mL DEVI Inject 0.3 mg into the muscle once.      Marland Kitchen glipiZIDE (GLUCOTROL XL) 10 MG 24 hr tablet Take 10 mg by mouth every evening.  1  . HYDROcodone-acetaminophen (NORCO/VICODIN) 5-325 MG tablet Take 1-2 tablets by mouth every 6 (six) hours as needed. 10 tablet 0  . levothyroxine (SYNTHROID, LEVOTHROID) 50 MCG tablet Take 50 mcg by mouth at bedtime.     Marland Kitchen losartan (COZAAR) 50 MG tablet Take 50 mg by mouth at bedtime.      . metFORMIN (GLUCOPHAGE) 1000 MG tablet Take 1,000 mg by mouth 2 (two) times daily.    . metoprolol (TOPROL-XL) 50 MG 24 hr tablet Take 50 mg by mouth at bedtime.      . pravastatin (PRAVACHOL) 20 MG tablet Take 20 mg by mouth at bedtime.      . sitaGLIPtin (JANUVIA) 100 MG tablet Take 100 mg by mouth at  bedtime.     Marland Kitchen zolpidem (AMBIEN) 10 MG tablet Take 10 mg by mouth at bedtime. For sleep     Social History   Socioeconomic History  . Marital status: Married    Spouse name: Not on file  . Number of children: Not on file  . Years of education: Not on file  . Highest education level: Not on file  Occupational History  . Not on file  Tobacco Use  . Smoking status: Never Smoker  . Smokeless tobacco: Current User    Types: Snuff  Substance and Sexual Activity  . Alcohol use: No  . Drug use: No  . Sexual activity: Not on file  Other Topics Concern  . Not on file  Social History Narrative  . Not on file   Social Determinants of Health   Financial Resource Strain:   . Difficulty of Paying Living Expenses:   Food Insecurity:   . Worried About Charity fundraiser in the Last Year:   . Arboriculturist in the Last Year:   Transportation Needs:   . Film/video editor (Medical):   Marland Kitchen Lack of Transportation (Non-Medical):   Physical Activity:   . Days of Exercise per Week:   . Minutes of Exercise per Session:  Stress:   . Feeling of Stress :   Social Connections:   . Frequency of Communication with Friends and Family:   . Frequency of Social Gatherings with Friends and Family:   . Attends Religious Services:   . Active Member of Clubs or Organizations:   . Attends Archivist Meetings:   Marland Kitchen Marital Status:   Intimate Partner Violence:   . Fear of Current or Ex-Partner:   . Emotionally Abused:   Marland Kitchen Physically Abused:   . Sexually Abused:    Family History  Problem Relation Age of Onset  . Coronary artery disease Mother   . Coronary artery disease Father     OBJECTIVE:  Vitals:   01/24/20 1333  BP: 132/85  Pulse: 88  Resp: 18  Temp: 98.8 F (37.1 C)  SpO2: 96%     General appearance: alert; no distress Skin: 3 x 4 cm area of induration with overlying erythema, TTP, no obvious drainage or bleeding Psychological: alert and cooperative; normal mood  and affect   ASSESSMENT & PLAN:  1. Cellulitis and abscess of trunk     Meds ordered this encounter  Medications  . doxycycline (VIBRAMYCIN) 100 MG capsule    Sig: Take 1 capsule (100 mg total) by mouth 2 (two) times daily.    Dispense:  20 capsule    Refill:  0    Order Specific Question:   Supervising Provider    Answer:   Raylene Everts [[id number]]   Apply warm compresses 3-4x daily for 10-15 minutes Wash site daily with warm water and mild soap Keep covered to avoid friction Take antibiotic as prescribed and to completion Follow up here or with PCP if symptoms persists Return or go to the ED if you have any new or worsening symptoms increased redness, swelling, pain, nausea, vomiting, fever, chills, etc...    Reviewed expectations re: course of current medical issues. Questions answered. Outlined signs and symptoms indicating need for more acute intervention. Patient verbalized understanding. After Visit Summary given.          Lestine Box, PA-C 01/24/20 1351

## 2020-01-27 ENCOUNTER — Other Ambulatory Visit: Payer: Self-pay

## 2020-01-27 ENCOUNTER — Ambulatory Visit: Payer: Medicare Other | Admitting: Surgery

## 2020-01-27 ENCOUNTER — Encounter: Payer: Self-pay | Admitting: Surgery

## 2020-01-27 ENCOUNTER — Ambulatory Visit (INDEPENDENT_AMBULATORY_CARE_PROVIDER_SITE_OTHER): Payer: Medicare Other | Admitting: Surgery

## 2020-01-27 VITALS — BP 147/90 | HR 97 | Temp 97.7°F | Resp 12 | Ht 63.0 in | Wt 162.0 lb

## 2020-01-27 DIAGNOSIS — L03319 Cellulitis of trunk, unspecified: Secondary | ICD-10-CM | POA: Diagnosis not present

## 2020-01-27 DIAGNOSIS — L02219 Cutaneous abscess of trunk, unspecified: Secondary | ICD-10-CM

## 2020-01-27 DIAGNOSIS — L720 Epidermal cyst: Secondary | ICD-10-CM | POA: Diagnosis not present

## 2020-01-27 NOTE — Patient Instructions (Addendum)
Today we have drained your Abscess in the office. The numbing medication will wear off in approximately 4-8 hours. You will have some pain to the area afterwards but should not be as severe as prior to the procedure.  Please continue to take them after your procedure.  Change the packing once a day. When you shower at night, remove the gauze and packing. Shower and let soap and water run down the area wound. After showering, pat dry the wound. Pack the site and place a dry gauze on top of the wound.   Take Tylenol and Ibuprofen for pain as needed. Place ice packs on the area to help relieve swelling.   We will see you back as scheduled below.   If you have any questions or concerns prior to your appointment, please call our office and speak with a nurse.  Incision and Drainage Incision and drainage is a surgical procedure to open and drain a fluid-filled sac. The sac may be filled with pus, mucus, or blood. Examples of fluid-filled sacs that may need surgical drainage include cysts, skin infections (abscesses), and red lumps that develop from a ruptured cyst or a small abscess (boils). You may need this procedure if the affected area is large, painful, infected, or not healing well. Tell a health care provider about:  Any allergies you have.  All medicines you are taking, including vitamins, herbs, eye drops, creams, and over-the-counter medicines.  Any problems you or family members have had with anesthetic medicines.  Any blood disorders you have.  Any surgeries you have had.  Any medical conditions you have.  Whether you are pregnant or may be pregnant. What are the risks? Generally, this is a safe procedure. However, problems may occur, including:  Infection.  Bleeding.  Allergic reactions to medicines.  Scarring.  What happens before the procedure?  You may need an ultrasound or other imaging tests to see how large or deep the fluid-filled sac is.  You may have blood  tests to check for infection.  You may get a tetanus shot.  You may be given antibiotic medicine to help prevent infection.  Follow instructions from your health care provider about eating or drinking restrictions.  Ask your health care provider about: ? Changing or stopping your regular medicines. This is especially important if you are taking diabetes medicines or blood thinners. ? Taking medicines such as aspirin and ibuprofen. These medicines can thin your blood. Do not take these medicines before your procedure if your health care provider instructs you not to.  Plan to have someone take you home after the procedure.  If you will be going home right after the procedure, plan to have someone stay with you for 24 hours. What happens during the procedure?  To reduce your risk of infection: ? Your health care team will wash or sanitize their hands. ? Your skin will be washed with soap.  You will be given one or more of the following: ? A medicine to help you relax (sedative). ? A medicine to numb the area (local anesthetic). ? A medicine to make you fall asleep (general anesthetic).  An incision will be made in the top of the fluid-filled sac.  The contents of the sac may be squeezed out, or a syringe or tube (catheter)may be used to empty the sac.  The catheter may be left in place for several weeks to drain any fluid. Or, your health care provider may stitch open the edges of the  incision to make a long-term opening for drainage (marsupialization).  The inside of the sac may be washed out (irrigated) with a sterile solution and packed with gauze before it is covered with a bandage (dressing). The procedure may vary among health care providers and hospitals. What happens after the procedure?  Your blood pressure, heart rate, breathing rate, and blood oxygen level will be monitored often until the medicines you were given have worn off.  Do not drive for 24 hours if you  received a sedative. This information is not intended to replace advice given to you by your health care provider. Make sure you discuss any questions you have with your health care provider. Document Released: 01/11/2001 Document Revised: 12/24/2015 Document Reviewed: 05/08/2015 Elsevier Interactive Patient Education  2017 [employer].   Incision and Drainage, Care After Refer to this sheet in the next few weeks. These instructions provide you with information about caring for yourself after your procedure. Your health care provider may also give you more specific instructions. Your treatment has been planned according to current medical practices, but problems sometimes occur. Call your health care provider if you have any problems or questions after your procedure. What can I expect after the procedure? After the procedure, it is common to have:  Pain or discomfort around your incision site.  Drainage from your incision.  Follow these instructions at home:  Take over-the-counter and prescription medicines only as told by your health care provider.  If you were prescribed an antibiotic medicine, take it as told by your health care provider.Do not stop taking the antibiotic even if you start to feel better.  Followinstructions from your health care provider about: ? How to take care of your incision. ? When and how you should change your packing and bandage (dressing). Wash your hands with soap and water before you change your dressing. If soap and water are not available, use hand sanitizer. ? When you should remove your dressing.  Do not take baths, swim, or use a hot tub until your health care provider approves.  Keep all follow-up visits as told by your health care provider. This is important.  Check your incision area every day for signs of infection. Check for: ? More redness, swelling, or pain. ? More fluid or blood. ? Warmth. ? Pus or a bad smell. Contact a health care  provider if:  Your cyst or abscess returns.  You have a fever.  You have more redness, swelling, or pain around your incision.  You have more fluid or blood coming from your incision.  Your incision feels warm to the touch.  You have pus or a bad smell coming from your incision. Get help right away if:  You have severe pain or bleeding.  You cannot eat or drink without vomiting.  You have decreased urine output.  You become short of breath.  You have chest pain.  You cough up blood.  The area where the incision and drainage occurred becomes numb or it tingles. This information is not intended to replace advice given to you by your health care provider. Make sure you discuss any questions you have with your health care provider. Document Released: 10/10/2011 Document Revised: 12/18/2015 Document Reviewed: 05/08/2015 Elsevier Interactive Patient Education  2017 [employer].

## 2020-01-28 ENCOUNTER — Telehealth: Payer: Self-pay | Admitting: *Deleted

## 2020-01-28 NOTE — Telephone Encounter (Signed)
Spoke with patient's daughter and provided her with step by step dressing, packing and wound care instructions. Answered all questions that she had as far as wound care. Advised patient's daughter to give our office a call if she has any questions or concerns. Verbalized understanding and has no further questions.

## 2020-01-28 NOTE — Telephone Encounter (Signed)
Patient called and stated that she was seen here yesterday and had her abscess drained and then packed. Patient wants to have some clarification of exactly what to do when packing and unpacking the wound

## 2020-01-29 NOTE — Progress Notes (Signed)
Surgical Consultation  01/29/2020  Sherry Fields is an 64 y.o. female.   Chief Complaint  Patient presents with  . Follow-up    abscess on abdomen     HPI: Sherry Fields is a 64 year old female seen in consultation at the request of Mrs. Wurst PA-C.  Reports that over 1 week she has felt pain on the right side of her abdominal wall.  The pain is sharp intermittent worsening when she presses on her abdominal wall.  She does have associated cellulitis with erythema.  No fevers or chills.  Of note she did have a remote history of cholecystectomy greater than 3 years ago.  She does have significant anxiety as well as diabetes.  She was recently seen by her primary care provider and she was placed on doxycycline.  She reports no change since antibiotics were started.  She continues to have worsening pain for the last 24 to 48 hours.  Past Medical History:  Diagnosis Date  . Anxiety   . Depression   . Diabetes mellitus   . Hyperlipidemia   . Hypertension   . Hypothyroid   . Insomnia     Past Surgical History:  Procedure Laterality Date  . CHOLECYSTECTOMY N/A 10/17/2016   Procedure: LAPAROSCOPIC CHOLECYSTECTOMY  (no gram);  Surgeon: Sherry Husbands, MD;  Location: [facility] ORS;  Service: General;  Laterality: N/A;  . THYROIDECTOMY      Family History  Problem Relation Age of Onset  . Coronary artery disease Mother   . Coronary artery disease Father     Social History:  reports that she has never smoked. Her smokeless tobacco use includes snuff. She reports that she does not drink alcohol and does not use drugs.  Allergies:  Allergies  Allergen Reactions  . Naproxen Anaphylaxis  . Poison Sumac Extract Hives and Rash    Medications reviewed.  ROS Full ROS performed and is otherwise negative other than what is stated in the HPI    BP (!) 147/90   Pulse 97   Temp 97.7 F (36.5 C)   Resp 12   Ht 5\' 3"  (1.6 m)   Wt 162 lb (73.5 kg)   SpO2 97%   BMI 28.70 kg/m   Physical  Exam Vitals and nursing note reviewed. Exam conducted with a chaperone present.  Constitutional:      Appearance: Normal appearance. She is normal weight.  Eyes:     General:        Right eye: No discharge.        Left eye: No discharge.  Cardiovascular:     Rate and Rhythm: Normal rate and regular rhythm.  Pulmonary:     Effort: Pulmonary effort is normal. No respiratory distress.     Breath sounds: Normal breath sounds.  Abdominal:     General: Abdomen is flat. There is no distension.     Palpations: Abdomen is soft. There is no mass.     Tenderness: There is abdominal tenderness. There is no left CVA tenderness, guarding or rebound.     Hernia: No hernia is present.     Comments: Evidence of induration, blanching erythema as well as fluctuance consistent with an abdominal wall abscess located to the right side supra umbilical  Musculoskeletal:        General: Normal range of motion.     Cervical back: Normal range of motion and neck supple.  Skin:    General: Skin is warm and dry.     Capillary  Refill: Capillary refill takes less than 2 seconds.  Neurological:     General: No focal deficit present.     Mental Status: She is alert and oriented to person, place, and time.  Psychiatric:        Mood and Affect: Mood normal.        Behavior: Behavior normal.        Thought Content: Thought content normal.        Judgment: Judgment normal.     Assessment/Plan: 64 year old female with complex abdominal wall abscess with associated cellulitis.  This is completely separate issues from previous incisions from cholecystectomy more than 3 years ago.  Had an extensive discussion with her and her friend regarding her disease process.  I definitely recommend incision drainage today.  Procedure discussed with the patient in detail.  Risks, benefits and possible occasions.  We will keep on antibiotics for now and  See her back  1 to 2 weeks.  Procedure Note: 1.  Incision and drainage of  complex abdominal wall abscess 2.  Excision of a 3.2 cm epidermal inclusion cyst from the abdominal wall 3.  Excisional debridement of skin and subcutaneous tissue measuring 4 x 3 cm= 12 cm  EBL: minimal  Anesthesia: Marcaine 1% with epinephrine, total volume 30 cc  Complications: none  After informed consent was obtained the patient was prepped and draped in the usual sterile fashion.  Local anesthetic was infiltrated and I perform an elliptical incision.  There was significant pus that was evacuated.  The complex loculations were broken down.  We obtain adequate cultures.  Then after dissection we found that t was an additional epidermal inclusion cyst that was infected.  I proceeded to excise it using Metzenbaum scissors.  We sent the specimen for permanent pathology and I really irrigated the wound.  Excisional debridement was performed with 15 blade knife.  Hemostasis was obtained with pressure.  The wound was packed with half-inch packing.  No complications    Sherry Hamman, MD [facility]

## 2020-02-02 LAB — ANAEROBIC AND AEROBIC CULTURE

## 2020-02-10 ENCOUNTER — Other Ambulatory Visit: Payer: Self-pay

## 2020-02-10 ENCOUNTER — Encounter: Payer: Self-pay | Admitting: Surgery

## 2020-02-10 ENCOUNTER — Ambulatory Visit (INDEPENDENT_AMBULATORY_CARE_PROVIDER_SITE_OTHER): Payer: Self-pay | Admitting: Surgery

## 2020-02-10 VITALS — BP 143/98 | HR 87 | Temp 98.6°F | Resp 12 | Wt 158.2 lb

## 2020-02-10 DIAGNOSIS — Z09 Encounter for follow-up examination after completed treatment for conditions other than malignant neoplasm: Secondary | ICD-10-CM

## 2020-02-10 NOTE — Progress Notes (Signed)
Status post I&D of abdominal wall abscess with excision of epidermal inclusion cyst.  Pathology discussed with patient detail.  She is doing well without any issues and wound has healed.  Physical exam: Abdomen is soft nontender close to without evidence of active infection.  A/P doing very well. RTC as needed

## 2020-02-10 NOTE — Patient Instructions (Signed)
Follow up as needed, call the office if you have any questions or concerns.  

## 2020-02-13 DIAGNOSIS — E119 Type 2 diabetes mellitus without complications: Secondary | ICD-10-CM | POA: Diagnosis not present

## 2020-02-13 DIAGNOSIS — E1165 Type 2 diabetes mellitus with hyperglycemia: Secondary | ICD-10-CM | POA: Diagnosis not present

## 2020-02-13 DIAGNOSIS — E782 Mixed hyperlipidemia: Secondary | ICD-10-CM | POA: Diagnosis not present

## 2020-02-13 DIAGNOSIS — I10 Essential (primary) hypertension: Secondary | ICD-10-CM | POA: Diagnosis not present

## 2020-02-13 DIAGNOSIS — E039 Hypothyroidism, unspecified: Secondary | ICD-10-CM | POA: Diagnosis not present

## 2020-02-18 DIAGNOSIS — G4700 Insomnia, unspecified: Secondary | ICD-10-CM | POA: Diagnosis not present

## 2020-02-18 DIAGNOSIS — E039 Hypothyroidism, unspecified: Secondary | ICD-10-CM | POA: Diagnosis not present

## 2020-02-18 DIAGNOSIS — E1165 Type 2 diabetes mellitus with hyperglycemia: Secondary | ICD-10-CM | POA: Diagnosis not present

## 2020-02-18 DIAGNOSIS — E782 Mixed hyperlipidemia: Secondary | ICD-10-CM | POA: Diagnosis not present

## 2020-02-18 DIAGNOSIS — I10 Essential (primary) hypertension: Secondary | ICD-10-CM | POA: Diagnosis not present

## 2020-03-19 DIAGNOSIS — E782 Mixed hyperlipidemia: Secondary | ICD-10-CM | POA: Diagnosis not present

## 2020-03-19 DIAGNOSIS — E039 Hypothyroidism, unspecified: Secondary | ICD-10-CM | POA: Diagnosis not present

## 2020-03-19 DIAGNOSIS — I10 Essential (primary) hypertension: Secondary | ICD-10-CM | POA: Diagnosis not present

## 2020-03-19 DIAGNOSIS — E1165 Type 2 diabetes mellitus with hyperglycemia: Secondary | ICD-10-CM | POA: Diagnosis not present

## 2020-03-19 DIAGNOSIS — G4700 Insomnia, unspecified: Secondary | ICD-10-CM | POA: Diagnosis not present

## 2020-10-28 DIAGNOSIS — G4700 Insomnia, unspecified: Secondary | ICD-10-CM | POA: Diagnosis not present

## 2020-10-28 DIAGNOSIS — N6002 Solitary cyst of left breast: Secondary | ICD-10-CM | POA: Diagnosis not present

## 2020-10-28 DIAGNOSIS — K219 Gastro-esophageal reflux disease without esophagitis: Secondary | ICD-10-CM | POA: Diagnosis not present

## 2020-10-28 DIAGNOSIS — L989 Disorder of the skin and subcutaneous tissue, unspecified: Secondary | ICD-10-CM | POA: Diagnosis not present

## 2020-10-28 DIAGNOSIS — I10 Essential (primary) hypertension: Secondary | ICD-10-CM | POA: Diagnosis not present

## 2020-10-28 DIAGNOSIS — E782 Mixed hyperlipidemia: Secondary | ICD-10-CM | POA: Diagnosis not present

## 2020-10-28 DIAGNOSIS — D72828 Other elevated white blood cell count: Secondary | ICD-10-CM | POA: Diagnosis not present

## 2020-10-28 DIAGNOSIS — E1165 Type 2 diabetes mellitus with hyperglycemia: Secondary | ICD-10-CM | POA: Diagnosis not present

## 2020-11-25 DIAGNOSIS — E1165 Type 2 diabetes mellitus with hyperglycemia: Secondary | ICD-10-CM | POA: Diagnosis not present

## 2020-11-25 DIAGNOSIS — E782 Mixed hyperlipidemia: Secondary | ICD-10-CM | POA: Diagnosis not present

## 2020-11-25 DIAGNOSIS — I10 Essential (primary) hypertension: Secondary | ICD-10-CM | POA: Diagnosis not present

## 2020-11-25 DIAGNOSIS — E039 Hypothyroidism, unspecified: Secondary | ICD-10-CM | POA: Diagnosis not present

## 2020-11-25 DIAGNOSIS — E119 Type 2 diabetes mellitus without complications: Secondary | ICD-10-CM | POA: Diagnosis not present

## 2020-12-02 DIAGNOSIS — E1165 Type 2 diabetes mellitus with hyperglycemia: Secondary | ICD-10-CM | POA: Diagnosis not present

## 2020-12-02 DIAGNOSIS — E039 Hypothyroidism, unspecified: Secondary | ICD-10-CM | POA: Diagnosis not present

## 2020-12-02 DIAGNOSIS — I10 Essential (primary) hypertension: Secondary | ICD-10-CM | POA: Diagnosis not present

## 2020-12-02 DIAGNOSIS — R051 Acute cough: Secondary | ICD-10-CM | POA: Diagnosis not present

## 2020-12-02 DIAGNOSIS — K219 Gastro-esophageal reflux disease without esophagitis: Secondary | ICD-10-CM | POA: Diagnosis not present

## 2020-12-02 DIAGNOSIS — D72829 Elevated white blood cell count, unspecified: Secondary | ICD-10-CM | POA: Diagnosis not present

## 2020-12-02 DIAGNOSIS — E782 Mixed hyperlipidemia: Secondary | ICD-10-CM | POA: Diagnosis not present

## 2020-12-22 ENCOUNTER — Encounter: Payer: Self-pay | Admitting: Nurse Practitioner

## 2020-12-22 ENCOUNTER — Telehealth: Payer: Medicare Other | Admitting: Nurse Practitioner

## 2020-12-22 ENCOUNTER — Other Ambulatory Visit: Payer: Self-pay | Admitting: Nurse Practitioner

## 2020-12-22 DIAGNOSIS — M25512 Pain in left shoulder: Secondary | ICD-10-CM | POA: Diagnosis not present

## 2020-12-22 DIAGNOSIS — S42035A Nondisplaced fracture of lateral end of left clavicle, initial encounter for closed fracture: Secondary | ICD-10-CM | POA: Diagnosis not present

## 2020-12-22 DIAGNOSIS — M13812 Other specified arthritis, left shoulder: Secondary | ICD-10-CM | POA: Diagnosis not present

## 2020-12-22 MED ORDER — CYCLOBENZAPRINE HCL 5 MG PO TABS: 5.0000 mg | ORAL_TABLET | Freq: Three times a day (TID) | ORAL | 0 refills | Status: AC | PRN

## 2020-12-22 NOTE — Patient Instructions (Signed)
Continue ibuprofen up to 600mg  every 6 hours with food for pain May use flexeril (muscle relaxer) as needed for muscle spasm/pain but do not combine with other sedatives or sleep aids as this will call sedation.   Do not drive while using muscle relaxer.   If pain persists with use of these medications it is highly advised to visit an urgent care, specifically an orthopedic urgent care as discussed for evaluation of shoulder pain.

## 2020-12-22 NOTE — Progress Notes (Signed)
[name], low are scheduled for a virtual visit with your provider today.    Just as we do with appointments in the office, we must obtain your consent to participate.  Your consent will be active for this visit and any virtual visit you may have with one of our providers in the next 365 days.    If you have a MyChart account, I can also send a copy of this consent to you electronically.  All virtual visits are billed to your insurance company just like a traditional visit in the office.  As this is a virtual visit, video technology does not allow for your provider to perform a traditional examination.  This may limit your provider's ability to fully assess your condition.  If your provider identifies any concerns that need to be evaluated in person or the need to arrange testing such as labs, EKG, etc, we will make arrangements to do so.    Although advances in technology are sophisticated, we cannot ensure that it will always work on either your end or our end.  If the connection with a video visit is poor, we may have to switch to a telephone visit.  With either a video or telephone visit, we are not always able to ensure that we have a secure connection.   I need to obtain your verbal consent now.   Are you willing to proceed with your visit today?   Sherry Fields has provided verbal consent on 12/22/2020 for a virtual visit (video or telephone).   Apolonio Schneiders, FNP 12/22/2020  9:52 AM     Virtual Visit via Video   I connected with patient on 12/22/20 at 10:00 AM EDT by a video enabled telemedicine application and verified that I am speaking with the correct person using two identifiers.  Location patient: Home Location provider: [place] participating in the virtual visit: Patient, daughter assisting with patient's phone & Provider  I discussed the limitations of evaluation and management by telemedicine and the availability of in person appointments. The patient  expressed understanding and agreed to proceed.  Subjective:   HPI:   Patient presents via [place] today with complaints of pain for three weeks in her left shoulder. She has used [employer], broadcasting/film/video and over the counter muscle rub along with arthritis cream she has without relief. The pain is worse when she lays on the shoulder, also when she tries to lift items. She is able to lift the arm without drop. Denies weakness in arm or hands. She has also taken ibuprofen and tylenol without relief.   Cannot remember a traumatic even that caused onset of pain. Pain is present at rest but worse with movement of arm or pressure to arm.  Denies any redness to area or history of surgery to area.   Denies onset of pain with any change in medications.     ROS:    See pertinent positives and negatives per HPI. +pain in left shoulder  -redness -numbness -weakness  Patient Active Problem List   Diagnosis Date Noted  . Diabetes 1.5, managed as type 1 ([place]) 09/29/2014  . Diabetes mellitus   . Hypertension   . Hypothyroid   . Insomnia   . Hyperlipidemia     Social History   Tobacco Use  . Smoking status: Never Smoker  . Smokeless tobacco: Current User    Types: Snuff  Substance Use Topics  . Alcohol use: No    Current Outpatient Medications:  .  atorvastatin (LIPITOR) 20 MG tablet, Take 20 mg by mouth daily., Disp: , Rfl:  .  EPINEPHrine (EPIPEN) 0.3 mg/0.3 mL DEVI, Inject 0.3 mg into the muscle once.  , Disp: , Rfl:  .  glipiZIDE (GLUCOTROL) 10 MG tablet, Take 10 mg by mouth daily., Disp: , Rfl:  .  JARDIANCE 10 MG TABS tablet, Take 10 mg by mouth daily., Disp: , Rfl:  .  levothyroxine (SYNTHROID) 100 MCG tablet, , Disp: , Rfl:  .  losartan (COZAAR) 50 MG tablet, Take 50 mg by mouth at bedtime.  , Disp: , Rfl:  .  metFORMIN (GLUCOPHAGE) 1000 MG tablet, Take 1,000 mg by mouth 2 (two) times daily., Disp: , Rfl:  .  sitaGLIPtin (JANUVIA) 100 MG tablet, Take 100 mg by mouth at bedtime. , Disp: ,  Rfl:  .  SYNJARDY 12.11-998 MG TABS, Take 1 tablet by mouth 2 (two) times daily., Disp: , Rfl:  .  zolpidem (AMBIEN) 10 MG tablet, Take 10 mg by mouth at bedtime. For sleep, Disp: , Rfl:   Allergies  Allergen Reactions  . Naproxen Anaphylaxis  . Poison Sumac Extract Hives and Rash    Objective:   There were no vitals taken for this visit.  Patient is well-developed, well-nourished in no acute distress.  Resting comfortably at home.  Head is normocephalic, atraumatic.  No labored breathing.  Speech is clear and coherent with logical content.  Patient is alert and oriented at baseline.  Able to lift left arm without arm drop Able to make a fist.  Shoulders appear symmetrical   Assessment and Plan:   Discussed with patient due to length of pain to area this may require a deeper investigation into the cause of pain.   Recommended a further evaluation at [facility] in [place].   Will prescribe Flexeril in order to keep patient comfortable and allow for sleep until she is able to be seen by orthopedics. Advised going to urgent care hours at that office, especially if no improvement with medications.   She will also continue the over the counter ibuprofen she has been taking, and will take that with food as instructed.   Advised against using Ambien when using flexeril as they are both sedatives, patient and daughter verbalize understanding of these instructions.    Apolonio Schneiders, FNP 12/22/2020

## 2020-12-31 DIAGNOSIS — S42035A Nondisplaced fracture of lateral end of left clavicle, initial encounter for closed fracture: Secondary | ICD-10-CM | POA: Diagnosis not present

## 2020-12-31 DIAGNOSIS — M13812 Other specified arthritis, left shoulder: Secondary | ICD-10-CM | POA: Diagnosis not present

## 2020-12-31 DIAGNOSIS — M25512 Pain in left shoulder: Secondary | ICD-10-CM | POA: Diagnosis not present

## 2021-01-25 DIAGNOSIS — E039 Hypothyroidism, unspecified: Secondary | ICD-10-CM | POA: Diagnosis not present

## 2021-01-28 DIAGNOSIS — I10 Essential (primary) hypertension: Secondary | ICD-10-CM | POA: Diagnosis not present

## 2021-01-28 DIAGNOSIS — E1165 Type 2 diabetes mellitus with hyperglycemia: Secondary | ICD-10-CM | POA: Diagnosis not present

## 2021-02-04 DIAGNOSIS — E039 Hypothyroidism, unspecified: Secondary | ICD-10-CM | POA: Diagnosis not present

## 2021-02-04 DIAGNOSIS — E782 Mixed hyperlipidemia: Secondary | ICD-10-CM | POA: Diagnosis not present

## 2021-02-04 DIAGNOSIS — I10 Essential (primary) hypertension: Secondary | ICD-10-CM | POA: Diagnosis not present

## 2021-02-04 DIAGNOSIS — E1165 Type 2 diabetes mellitus with hyperglycemia: Secondary | ICD-10-CM | POA: Diagnosis not present

## 2021-02-04 DIAGNOSIS — M79609 Pain in unspecified limb: Secondary | ICD-10-CM | POA: Diagnosis not present

## 2021-02-11 DIAGNOSIS — M25512 Pain in left shoulder: Secondary | ICD-10-CM | POA: Diagnosis not present

## 2021-02-28 DIAGNOSIS — I10 Essential (primary) hypertension: Secondary | ICD-10-CM | POA: Diagnosis not present

## 2021-02-28 DIAGNOSIS — E1165 Type 2 diabetes mellitus with hyperglycemia: Secondary | ICD-10-CM | POA: Diagnosis not present

## 2021-04-30 DIAGNOSIS — I10 Essential (primary) hypertension: Secondary | ICD-10-CM | POA: Diagnosis not present

## 2021-04-30 DIAGNOSIS — E1165 Type 2 diabetes mellitus with hyperglycemia: Secondary | ICD-10-CM | POA: Diagnosis not present

## 2021-05-20 DIAGNOSIS — Z760 Encounter for issue of repeat prescription: Secondary | ICD-10-CM | POA: Diagnosis not present

## 2021-05-20 DIAGNOSIS — E039 Hypothyroidism, unspecified: Secondary | ICD-10-CM | POA: Diagnosis not present

## 2021-05-20 DIAGNOSIS — E1165 Type 2 diabetes mellitus with hyperglycemia: Secondary | ICD-10-CM | POA: Diagnosis not present

## 2021-05-20 DIAGNOSIS — E782 Mixed hyperlipidemia: Secondary | ICD-10-CM | POA: Diagnosis not present

## 2021-05-20 DIAGNOSIS — I10 Essential (primary) hypertension: Secondary | ICD-10-CM | POA: Diagnosis not present

## 2021-05-24 ENCOUNTER — Other Ambulatory Visit: Payer: Self-pay | Admitting: Family Medicine

## 2021-05-24 DIAGNOSIS — Z853 Personal history of malignant neoplasm of breast: Secondary | ICD-10-CM

## 2021-05-24 DIAGNOSIS — E039 Hypothyroidism, unspecified: Secondary | ICD-10-CM | POA: Diagnosis not present

## 2021-05-24 DIAGNOSIS — K219 Gastro-esophageal reflux disease without esophagitis: Secondary | ICD-10-CM | POA: Diagnosis not present

## 2021-05-24 DIAGNOSIS — I10 Essential (primary) hypertension: Secondary | ICD-10-CM | POA: Diagnosis not present

## 2021-05-24 DIAGNOSIS — D72829 Elevated white blood cell count, unspecified: Secondary | ICD-10-CM | POA: Diagnosis not present

## 2021-05-24 DIAGNOSIS — E1165 Type 2 diabetes mellitus with hyperglycemia: Secondary | ICD-10-CM | POA: Diagnosis not present

## 2021-05-24 DIAGNOSIS — E782 Mixed hyperlipidemia: Secondary | ICD-10-CM | POA: Diagnosis not present

## 2021-05-24 DIAGNOSIS — E876 Hypokalemia: Secondary | ICD-10-CM | POA: Diagnosis not present

## 2021-05-24 DIAGNOSIS — R051 Acute cough: Secondary | ICD-10-CM | POA: Diagnosis not present

## 2021-05-24 DIAGNOSIS — T783XXA Angioneurotic edema, initial encounter: Secondary | ICD-10-CM | POA: Diagnosis not present

## 2021-05-31 DIAGNOSIS — I10 Essential (primary) hypertension: Secondary | ICD-10-CM | POA: Diagnosis not present

## 2021-05-31 DIAGNOSIS — E1165 Type 2 diabetes mellitus with hyperglycemia: Secondary | ICD-10-CM | POA: Diagnosis not present

## 2021-06-29 DIAGNOSIS — R112 Nausea with vomiting, unspecified: Secondary | ICD-10-CM | POA: Diagnosis not present

## 2021-06-29 DIAGNOSIS — E1165 Type 2 diabetes mellitus with hyperglycemia: Secondary | ICD-10-CM | POA: Diagnosis not present

## 2021-06-30 DIAGNOSIS — R519 Headache, unspecified: Secondary | ICD-10-CM | POA: Diagnosis not present

## 2021-07-30 DIAGNOSIS — I10 Essential (primary) hypertension: Secondary | ICD-10-CM | POA: Diagnosis not present

## 2021-07-30 DIAGNOSIS — E782 Mixed hyperlipidemia: Secondary | ICD-10-CM | POA: Diagnosis not present

## 2021-08-12 DIAGNOSIS — E039 Hypothyroidism, unspecified: Secondary | ICD-10-CM | POA: Diagnosis not present

## 2021-08-12 DIAGNOSIS — I10 Essential (primary) hypertension: Secondary | ICD-10-CM | POA: Diagnosis not present

## 2021-08-12 DIAGNOSIS — E1165 Type 2 diabetes mellitus with hyperglycemia: Secondary | ICD-10-CM | POA: Diagnosis not present

## 2021-08-19 DIAGNOSIS — Z1211 Encounter for screening for malignant neoplasm of colon: Secondary | ICD-10-CM | POA: Diagnosis not present

## 2021-08-19 DIAGNOSIS — E876 Hypokalemia: Secondary | ICD-10-CM | POA: Diagnosis not present

## 2021-08-19 DIAGNOSIS — R051 Acute cough: Secondary | ICD-10-CM | POA: Diagnosis not present

## 2021-08-19 DIAGNOSIS — E039 Hypothyroidism, unspecified: Secondary | ICD-10-CM | POA: Diagnosis not present

## 2021-08-19 DIAGNOSIS — Z0001 Encounter for general adult medical examination with abnormal findings: Secondary | ICD-10-CM | POA: Diagnosis not present

## 2021-08-19 DIAGNOSIS — E782 Mixed hyperlipidemia: Secondary | ICD-10-CM | POA: Diagnosis not present

## 2021-08-19 DIAGNOSIS — I10 Essential (primary) hypertension: Secondary | ICD-10-CM | POA: Diagnosis not present

## 2021-08-19 DIAGNOSIS — E1165 Type 2 diabetes mellitus with hyperglycemia: Secondary | ICD-10-CM | POA: Diagnosis not present

## 2021-08-19 DIAGNOSIS — K219 Gastro-esophageal reflux disease without esophagitis: Secondary | ICD-10-CM | POA: Diagnosis not present

## 2021-09-23 ENCOUNTER — Inpatient Hospital Stay: Admission: RE | Admit: 2021-09-23 | Payer: Medicare Other | Source: Ambulatory Visit

## 2021-10-14 ENCOUNTER — Inpatient Hospital Stay: Admission: RE | Admit: 2021-10-14 | Payer: Medicare Other | Source: Ambulatory Visit

## 2021-11-18 DIAGNOSIS — I10 Essential (primary) hypertension: Secondary | ICD-10-CM | POA: Diagnosis not present

## 2021-11-18 DIAGNOSIS — E1165 Type 2 diabetes mellitus with hyperglycemia: Secondary | ICD-10-CM | POA: Diagnosis not present

## 2021-11-18 DIAGNOSIS — E039 Hypothyroidism, unspecified: Secondary | ICD-10-CM | POA: Diagnosis not present

## 2021-11-25 DIAGNOSIS — I10 Essential (primary) hypertension: Secondary | ICD-10-CM | POA: Diagnosis not present

## 2021-11-25 DIAGNOSIS — Z1211 Encounter for screening for malignant neoplasm of colon: Secondary | ICD-10-CM | POA: Diagnosis not present

## 2021-11-25 DIAGNOSIS — E876 Hypokalemia: Secondary | ICD-10-CM | POA: Diagnosis not present

## 2021-11-25 DIAGNOSIS — K219 Gastro-esophageal reflux disease without esophagitis: Secondary | ICD-10-CM | POA: Diagnosis not present

## 2021-11-25 DIAGNOSIS — R051 Acute cough: Secondary | ICD-10-CM | POA: Diagnosis not present

## 2021-11-25 DIAGNOSIS — T753XXA Motion sickness, initial encounter: Secondary | ICD-10-CM | POA: Diagnosis not present

## 2021-11-25 DIAGNOSIS — E039 Hypothyroidism, unspecified: Secondary | ICD-10-CM | POA: Diagnosis not present

## 2021-11-25 DIAGNOSIS — E782 Mixed hyperlipidemia: Secondary | ICD-10-CM | POA: Diagnosis not present

## 2021-11-25 DIAGNOSIS — E1165 Type 2 diabetes mellitus with hyperglycemia: Secondary | ICD-10-CM | POA: Diagnosis not present

## 2021-11-28 DIAGNOSIS — E1165 Type 2 diabetes mellitus with hyperglycemia: Secondary | ICD-10-CM | POA: Diagnosis not present

## 2021-11-28 DIAGNOSIS — I10 Essential (primary) hypertension: Secondary | ICD-10-CM | POA: Diagnosis not present

## 2021-11-28 DIAGNOSIS — E039 Hypothyroidism, unspecified: Secondary | ICD-10-CM | POA: Diagnosis not present

## 2021-11-28 DIAGNOSIS — E782 Mixed hyperlipidemia: Secondary | ICD-10-CM | POA: Diagnosis not present

## 2021-12-29 DIAGNOSIS — E782 Mixed hyperlipidemia: Secondary | ICD-10-CM | POA: Diagnosis not present

## 2021-12-29 DIAGNOSIS — I10 Essential (primary) hypertension: Secondary | ICD-10-CM | POA: Diagnosis not present

## 2021-12-29 DIAGNOSIS — E039 Hypothyroidism, unspecified: Secondary | ICD-10-CM | POA: Diagnosis not present

## 2021-12-29 DIAGNOSIS — E1165 Type 2 diabetes mellitus with hyperglycemia: Secondary | ICD-10-CM | POA: Diagnosis not present

## 2022-01-13 DIAGNOSIS — M7542 Impingement syndrome of left shoulder: Secondary | ICD-10-CM | POA: Diagnosis not present

## 2022-03-01 DIAGNOSIS — E1165 Type 2 diabetes mellitus with hyperglycemia: Secondary | ICD-10-CM | POA: Diagnosis not present

## 2022-03-01 DIAGNOSIS — R0781 Pleurodynia: Secondary | ICD-10-CM | POA: Diagnosis not present

## 2022-03-07 DIAGNOSIS — R112 Nausea with vomiting, unspecified: Secondary | ICD-10-CM | POA: Diagnosis not present

## 2022-03-07 DIAGNOSIS — R0781 Pleurodynia: Secondary | ICD-10-CM | POA: Diagnosis not present

## 2022-03-07 DIAGNOSIS — E1165 Type 2 diabetes mellitus with hyperglycemia: Secondary | ICD-10-CM | POA: Diagnosis not present

## 2022-03-07 DIAGNOSIS — I10 Essential (primary) hypertension: Secondary | ICD-10-CM | POA: Diagnosis not present

## 2022-03-08 ENCOUNTER — Encounter: Payer: Self-pay | Admitting: Emergency Medicine

## 2022-03-08 ENCOUNTER — Other Ambulatory Visit: Payer: Self-pay

## 2022-03-08 ENCOUNTER — Ambulatory Visit
Admission: EM | Admit: 2022-03-08 | Discharge: 2022-03-08 | Disposition: A | Discharge status: Home / Self Care | Payer: Medicare Other | Attending: Family Medicine | Admitting: Family Medicine

## 2022-03-08 DIAGNOSIS — R197 Diarrhea, unspecified: Secondary | ICD-10-CM | POA: Diagnosis not present

## 2022-03-08 DIAGNOSIS — R1030 Lower abdominal pain, unspecified: Secondary | ICD-10-CM | POA: Diagnosis not present

## 2022-03-08 LAB — POCT URINALYSIS DIP (MANUAL ENTRY)
Bilirubin, UA: NEGATIVE
Blood, UA: NEGATIVE
Glucose, UA: >=1000 mg/dL — AB
Ketones, POC UA: NEGATIVE mg/dL
Leukocytes, UA: NEGATIVE
Nitrite, UA: POSITIVE — AB
Protein Ur, POC: NEGATIVE mg/dL
Spec Grav, UA: <=1.005 — AB (ref 1.010–1.025)
Urobilinogen, UA: 1 E.U./dL
pH, UA: 5 (ref 5.0–8.0)

## 2022-03-08 MED ORDER — CEPHALEXIN 500 MG PO CAPS: 500.0000 mg | ORAL_CAPSULE | Freq: Two times a day (BID) | ORAL | 0 refills | Status: DC

## 2022-03-08 MED ORDER — LOPERAMIDE HCL 2 MG PO CAPS: 2.0000 mg | ORAL_CAPSULE | Freq: Four times a day (QID) | ORAL | 0 refills | Status: AC | PRN

## 2022-03-08 NOTE — ED Triage Notes (Signed)
Pt reports emesis, diarrhea,abd pain since 7/22. Pt reports has zofran '4mg'$  that is not helping and reports indigestion after taking anti-emetic.

## 2022-03-08 NOTE — ED Provider Notes (Signed)
[facility]    CSN: [id number] Arrival date & time: 03/08/22  1700      History   Chief Complaint Chief Complaint  Patient presents with   Emesis    HPI Sherry Fields is a 66 y.o. female.   Presenting today with 3-week history of nausea, vomiting, diarrhea, diffuse lower abdominal pain.  States this started after she started a new diabetes medication, now switched from that medication to [place] but still having symptoms.  Has been trying Zofran given by PCP yesterday with no relief.  Notes after she has these pain episodes she has indigestion afterwards.  The pain is episodic, sharp in the lower abdomen and resolves quickly on its own.  Denies fever, chills, body aches, upper respiratory symptoms, history of chronic GI issues.  Does note she is drinking a lot of fluids and not able to urinate as much as she like, feeling some pressure in her bladder region at times.  Took an Azo yesterday for this but does not notice that it helped much.    Past Medical History:  Diagnosis Date   Anxiety    Depression    Diabetes mellitus    Hyperlipidemia    Hypertension    Hypothyroid    Insomnia     Patient Active Problem List   Diagnosis Date Noted   Diabetes 1.5, managed as type 1 ([place]) 09/29/2014   Diabetes mellitus    Hypertension    Hypothyroid    Insomnia    Hyperlipidemia     Past Surgical History:  Procedure Laterality Date   CHOLECYSTECTOMY N/A 10/17/2016   Procedure: LAPAROSCOPIC CHOLECYSTECTOMY  (no gram);  Surgeon: Jules Husbands, MD;  Location: [facility];  Service: General;  Laterality: N/A;   THYROIDECTOMY      OB History   No obstetric history on file.      Home Medications    Prior to Admission medications   Medication Sig Start Date End Date Taking? Authorizing Provider  cephALEXin (KEFLEX) 500 MG capsule Take 1 capsule (500 mg total) by mouth 2 (two) times daily. 03/08/22  Yes Volney American, PA-C  loperamide (IMODIUM) 2 MG capsule  Take 1 capsule (2 mg total) by mouth 4 (four) times daily as needed for diarrhea or loose stools. 03/08/22  Yes Volney American, PA-C  atorvastatin (LIPITOR) 20 MG tablet Take 20 mg by mouth daily. 01/07/20   [provider]  EPINEPHrine (EPIPEN) 0.3 mg/0.3 mL DEVI Inject 0.3 mg into the muscle once.      [provider]  glipiZIDE (GLUCOTROL) 10 MG tablet Take 10 mg by mouth daily. 12/02/20   [provider]  JARDIANCE 10 MG TABS tablet Take 10 mg by mouth daily. 11/18/19   [provider]  levothyroxine (SYNTHROID) 88 MCG tablet Take 88 mcg by mouth daily before breakfast.    [provider]  losartan (COZAAR) 50 MG tablet Take 50 mg by mouth at bedtime.      [provider]  omeprazole (PRILOSEC) 40 MG capsule Take 40 mg by mouth as needed.    [provider]  SYNJARDY 12.11-998 MG TABS Take 1 tablet by mouth 2 (two) times daily. 12/02/20   [provider]  zolpidem (AMBIEN) 10 MG tablet Take 10 mg by mouth at bedtime. For sleep    [provider]    Family History Family History  Problem Relation Age of Onset   Coronary artery disease Mother  Coronary artery disease Father     Social History Social History   Tobacco Use   Smoking status: Never   Smokeless tobacco: Current    Types: Snuff  Substance Use Topics   Alcohol use: No   Drug use: No     Allergies   Naproxen and Poison sumac extract   Review of Systems Review of Systems Per HPI  Physical Exam Triage Vital Signs ED Triage Vitals  Enc Vitals Group     BP 03/08/22 1717 125/84     Pulse Rate 03/08/22 1717 89     Resp 03/08/22 1717 20     Temp 03/08/22 1717 97.9 F (36.6 C)     Temp Source 03/08/22 1717 Oral     SpO2 03/08/22 1717 97 %     Weight --      Height --      Head Circumference --      Peak Flow --      Pain Score 03/08/22 1718 6     Pain Loc --      Pain Edu? --      Excl. in [place]? --    No data  found.  Updated Vital Signs BP 125/84 (BP Location: Right Arm)   Pulse 89   Temp 97.9 F (36.6 C) (Oral)   Resp 20   SpO2 97%   Visual Acuity Right Eye Distance:   Left Eye Distance:   Bilateral Distance:    Right Eye Near:   Left Eye Near:    Bilateral Near:     Physical Exam Vitals and nursing note reviewed.  Constitutional:      Appearance: Normal appearance. She is not ill-appearing.  HENT:     Head: Atraumatic.     Mouth/Throat:     Mouth: Mucous membranes are moist.  Eyes:     Extraocular Movements: Extraocular movements intact.     Conjunctiva/sclera: Conjunctivae normal.  Cardiovascular:     Rate and Rhythm: Normal rate and regular rhythm.     Heart sounds: Normal heart sounds.  Pulmonary:     Effort: Pulmonary effort is normal.     Breath sounds: Normal breath sounds.  Abdominal:     General: Bowel sounds are normal. There is no distension.     Palpations: Abdomen is soft.     Tenderness: There is abdominal tenderness. There is no right CVA tenderness, left CVA tenderness or guarding.     Comments: Minimal diffuse tenderness to palpation lower abdomen without distention or guarding  Musculoskeletal:        General: Normal range of motion.     Cervical back: Normal range of motion and neck supple.  Skin:    General: Skin is warm and dry.  Neurological:     Mental Status: She is alert and oriented to person, place, and time.  Psychiatric:        Mood and Affect: Mood normal.        Thought Content: Thought content normal.        Judgment: Judgment normal.      UC Treatments / Results  Labs (all labs ordered are listed, but only abnormal results are displayed) Labs Reviewed  POCT URINALYSIS DIP (MANUAL ENTRY) - Abnormal; Notable for the following components:      Result Value   Color, UA orange (*)    Glucose, UA >=1,000 (*)    Spec Grav, UA <=1.005 (*)    Nitrite, UA Positive (*)  All other components within normal limits  URINE CULTURE     EKG   Radiology No results found.  Procedures Procedures (including critical care time)  Medications Ordered in UC Medications - No data to display  Initial Impression / Assessment and Plan / UC Course  I have reviewed the triage vital signs and the nursing notes.  Pertinent labs & imaging results that were available during my care of the patient were reviewed by me and considered in my medical decision making (see chart for details).     Exam and vital signs benign and reassuring, urinalysis with possible urinary tract infection though Azo skewing results.  We will treat with Keflex while awaiting urine culture for confirmation.  We will also provide Imodium for symptomatic benefit.  Discussed close follow-up with PCP for recheck as she believes it may be related to her diabetes medication.  No red flag findings today on exam.  Final Clinical Impressions(s) / UC Diagnoses   Final diagnoses:  Lower abdominal pain  Diarrhea, unspecified type   Discharge Instructions   None    ED Prescriptions     Medication Sig Dispense Auth. Provider   cephALEXin (KEFLEX) 500 MG capsule Take 1 capsule (500 mg total) by mouth 2 (two) times daily. 10 capsule Volney American, [place]   loperamide (IMODIUM) 2 MG capsule Take 1 capsule (2 mg total) by mouth 4 (four) times daily as needed for diarrhea or loose stools. 12 capsule Volney American, [place]      PDMP not reviewed this encounter.   Volney American, [place] 03/08/22 1753

## 2022-03-10 LAB — URINE CULTURE

## 2022-03-17 ENCOUNTER — Other Ambulatory Visit: Payer: Self-pay | Admitting: Family Medicine

## 2022-03-17 DIAGNOSIS — E1165 Type 2 diabetes mellitus with hyperglycemia: Secondary | ICD-10-CM | POA: Diagnosis not present

## 2022-03-17 DIAGNOSIS — E876 Hypokalemia: Secondary | ICD-10-CM | POA: Diagnosis not present

## 2022-03-17 DIAGNOSIS — Z0000 Encounter for general adult medical examination without abnormal findings: Secondary | ICD-10-CM | POA: Diagnosis not present

## 2022-03-17 DIAGNOSIS — R051 Acute cough: Secondary | ICD-10-CM | POA: Diagnosis not present

## 2022-03-17 DIAGNOSIS — N63 Unspecified lump in unspecified breast: Secondary | ICD-10-CM

## 2022-03-17 DIAGNOSIS — E782 Mixed hyperlipidemia: Secondary | ICD-10-CM | POA: Diagnosis not present

## 2022-03-17 DIAGNOSIS — I10 Essential (primary) hypertension: Secondary | ICD-10-CM | POA: Diagnosis not present

## 2022-03-17 DIAGNOSIS — E039 Hypothyroidism, unspecified: Secondary | ICD-10-CM | POA: Diagnosis not present

## 2022-03-17 DIAGNOSIS — K219 Gastro-esophageal reflux disease without esophagitis: Secondary | ICD-10-CM | POA: Diagnosis not present

## 2022-03-17 DIAGNOSIS — R0781 Pleurodynia: Secondary | ICD-10-CM | POA: Diagnosis not present

## 2022-03-17 DIAGNOSIS — Z1239 Encounter for other screening for malignant neoplasm of breast: Secondary | ICD-10-CM | POA: Diagnosis not present

## 2022-04-21 ENCOUNTER — Other Ambulatory Visit: Payer: Medicare Other

## 2022-04-21 ENCOUNTER — Inpatient Hospital Stay: Admission: RE | Admit: 2022-04-21 | Payer: Medicare Other | Source: Ambulatory Visit

## 2022-05-12 ENCOUNTER — Ambulatory Visit
Admission: RE | Admit: 2022-05-12 | Discharge: 2022-05-12 | Disposition: A | Discharge status: Home / Self Care | Payer: Medicare Other | Source: Ambulatory Visit | Attending: Family Medicine | Admitting: Family Medicine

## 2022-05-12 DIAGNOSIS — R922 Inconclusive mammogram: Secondary | ICD-10-CM | POA: Diagnosis not present

## 2022-05-12 DIAGNOSIS — N63 Unspecified lump in unspecified breast: Secondary | ICD-10-CM | POA: Insufficient documentation

## 2022-05-12 DIAGNOSIS — Z1239 Encounter for other screening for malignant neoplasm of breast: Secondary | ICD-10-CM | POA: Insufficient documentation

## 2022-05-12 DIAGNOSIS — N644 Mastodynia: Secondary | ICD-10-CM | POA: Diagnosis not present

## 2022-05-17 ENCOUNTER — Other Ambulatory Visit (HOSPITAL_COMMUNITY): Payer: Self-pay | Admitting: Family Medicine

## 2022-05-17 DIAGNOSIS — R229 Localized swelling, mass and lump, unspecified: Secondary | ICD-10-CM | POA: Diagnosis not present

## 2022-05-18 ENCOUNTER — Other Ambulatory Visit: Payer: Self-pay | Admitting: *Deleted

## 2022-05-18 NOTE — Patient Outreach (Signed)
  Care Coordination   05/18/2022  Name: Sherry Fields MRN: [id number] DOB: 25-May-1956   Care Coordination Outreach Attempts:  An unsuccessful telephone outreach was attempted today to offer the patient information about available care coordination services as a benefit of their health plan. HIPAA compliant messages left on voicemail, providing contact information for CSW, encouraging patient to return CSW's call at her earliest convenience.  Follow Up Plan:  Additional outreach attempts will be made to offer the patient care coordination information and services.   Encounter Outcome:  No Answer.   Care Coordination Interventions Activated:  No.    Care Coordination Interventions:  No, not indicated.    Nat Christen, BSW, MSW, LCSW  Licensed Education officer, [facility] Health [facility]  Mailing [place] [address] [place], [place] [address] Physical Address-[address] [place], [place] [address] Toll Free Main # [phone] Fax # [phone] Cell # [phone] [name]

## 2022-05-25 ENCOUNTER — Encounter: Payer: Self-pay | Admitting: *Deleted

## 2022-05-25 ENCOUNTER — Ambulatory Visit: Payer: Self-pay | Admitting: *Deleted

## 2022-05-25 NOTE — Patient Instructions (Signed)
Visit Information  Thank you for taking time to visit with me today. Please don't hesitate to contact me if I can be of assistance to you.   Please call the care guide team at [phone] if you need to cancel or reschedule your appointment.   If you are experiencing a Mental Health or Behavioral Health Crisis or need someone to talk to, please call the Suicide and Crisis Lifeline: 988 call the [place] National Suicide Prevention Lifeline: 1-800-273-8255 or TTY: 1-800-799-4 TTY (1-800-799-4889) to talk to a trained counselor call 1-800-273-TALK (toll free, 24 hour hotline) go to [facility] [address], [place] ([phone]) call the [place] Crisis Line: 800-939-9988 call 911  Patient verbalizes understanding of instructions and care plan provided today and agrees to view in MyChart. Active MyChart status and patient understanding of how to access instructions and care plan via MyChart confirmed with patient.     No further follow up required.  Mackinley Kiehn, BSW, MSW, LCSW  Licensed Clinical Social Worker  [facility] [place] [facility]  Mailing Address-[address], [place], [place] [address] Physical Address-[address], [place], [place] [address] Toll Free Main # [phone] Fax # [phone] Cell # [phone] [email or web address]            

## 2022-05-25 NOTE — Patient Outreach (Signed)
  Care Coordination   Initial Visit Note   05/25/2022  Name: Sherry Fields MRN: [id number] DOB: 1955/08/03  Sherry Fields is a 66 y.o. year old female who sees Nevada Crane, Edwinna Areola, MD for primary care. I spoke with Leona Carry by phone today.  What matters to the patients health and wellness today?  No Interventions Identified.  SDOH assessments and interventions completed:  Yes.  SDOH Interventions Today    Flowsheet Row Most Recent Value  SDOH Interventions   Food Insecurity Interventions Intervention Not Indicated  Housing Interventions Intervention Not Indicated  Transportation Interventions Intervention Not Indicated  Utilities Interventions Intervention Not Indicated  Alcohol Usage Interventions Intervention Not Indicated (Score <7)  Financial Strain Interventions Intervention Not Indicated  Physical Activity Interventions Patient Refused  Stress Interventions Intervention Not Indicated  Social Connections Interventions Intervention Not Indicated     Care Coordination Interventions Activated:  Yes.   Care Coordination Interventions:  Yes, provided.   Follow up plan: No further intervention required.   Encounter Outcome:  Pt. Visit Completed.   Nat Christen, BSW, MSW, LCSW  Licensed Education officer, environmental Health System  Mailing [place] [address], [place], [place] [address] Physical Address-[address] [place], [place] [address] Toll Free Main # [phone] Fax # [phone] Cell # [phone] [email or web address]

## 2022-05-26 ENCOUNTER — Ambulatory Visit (HOSPITAL_COMMUNITY)
Admission: RE | Admit: 2022-05-26 | Discharge: 2022-05-26 | Disposition: A | Discharge status: Home / Self Care | Payer: Medicare Other | Source: Ambulatory Visit | Attending: Family Medicine | Admitting: Family Medicine

## 2022-05-26 DIAGNOSIS — R229 Localized swelling, mass and lump, unspecified: Secondary | ICD-10-CM | POA: Insufficient documentation

## 2022-05-26 DIAGNOSIS — R222 Localized swelling, mass and lump, trunk: Secondary | ICD-10-CM | POA: Diagnosis not present

## 2022-06-30 DIAGNOSIS — I10 Essential (primary) hypertension: Secondary | ICD-10-CM | POA: Diagnosis not present

## 2022-06-30 DIAGNOSIS — E1165 Type 2 diabetes mellitus with hyperglycemia: Secondary | ICD-10-CM | POA: Diagnosis not present

## 2022-07-07 DIAGNOSIS — K219 Gastro-esophageal reflux disease without esophagitis: Secondary | ICD-10-CM | POA: Diagnosis not present

## 2022-07-07 DIAGNOSIS — R0781 Pleurodynia: Secondary | ICD-10-CM | POA: Diagnosis not present

## 2022-07-07 DIAGNOSIS — E1165 Type 2 diabetes mellitus with hyperglycemia: Secondary | ICD-10-CM | POA: Diagnosis not present

## 2022-07-07 DIAGNOSIS — E876 Hypokalemia: Secondary | ICD-10-CM | POA: Diagnosis not present

## 2022-07-07 DIAGNOSIS — R051 Acute cough: Secondary | ICD-10-CM | POA: Diagnosis not present

## 2022-07-07 DIAGNOSIS — E782 Mixed hyperlipidemia: Secondary | ICD-10-CM | POA: Diagnosis not present

## 2022-07-07 DIAGNOSIS — E039 Hypothyroidism, unspecified: Secondary | ICD-10-CM | POA: Diagnosis not present

## 2022-07-07 DIAGNOSIS — I10 Essential (primary) hypertension: Secondary | ICD-10-CM | POA: Diagnosis not present

## 2022-09-01 ENCOUNTER — Other Ambulatory Visit (HOSPITAL_COMMUNITY): Payer: Self-pay | Admitting: Family Medicine

## 2022-09-01 DIAGNOSIS — R229 Localized swelling, mass and lump, unspecified: Secondary | ICD-10-CM | POA: Diagnosis not present

## 2022-09-01 DIAGNOSIS — R222 Localized swelling, mass and lump, trunk: Secondary | ICD-10-CM | POA: Diagnosis not present

## 2022-09-01 DIAGNOSIS — K219 Gastro-esophageal reflux disease without esophagitis: Secondary | ICD-10-CM | POA: Diagnosis not present

## 2022-09-14 ENCOUNTER — Ambulatory Visit
Admission: RE | Admit: 2022-09-14 | Discharge: 2022-09-14 | Disposition: A | Discharge status: Home / Self Care | Payer: 59 | Source: Ambulatory Visit | Attending: Family Medicine | Admitting: Family Medicine

## 2022-09-14 DIAGNOSIS — R229 Localized swelling, mass and lump, unspecified: Secondary | ICD-10-CM

## 2022-09-14 DIAGNOSIS — N63 Unspecified lump in unspecified breast: Secondary | ICD-10-CM | POA: Diagnosis not present

## 2022-09-14 DIAGNOSIS — N644 Mastodynia: Secondary | ICD-10-CM | POA: Diagnosis not present

## 2022-09-14 DIAGNOSIS — J9811 Atelectasis: Secondary | ICD-10-CM | POA: Diagnosis not present

## 2022-09-14 MED ORDER — IOHEXOL 300 MG/ML  SOLN
75.0000 mL | Freq: Once | INTRAMUSCULAR | Status: AC | PRN
  Administered 2022-09-14: 75 mL via INTRAVENOUS

## 2022-09-16 LAB — POCT I-STAT CREATININE: Creatinine, Ser: 0.8 mg/dL (ref 0.44–1.00)

## 2022-12-27 ENCOUNTER — Other Ambulatory Visit (INDEPENDENT_AMBULATORY_CARE_PROVIDER_SITE_OTHER): Payer: 59

## 2022-12-27 ENCOUNTER — Telehealth: Payer: Self-pay | Admitting: Radiology

## 2022-12-27 ENCOUNTER — Encounter: Payer: Self-pay | Admitting: Orthopaedic Surgery

## 2022-12-27 ENCOUNTER — Ambulatory Visit (INDEPENDENT_AMBULATORY_CARE_PROVIDER_SITE_OTHER): Payer: 59 | Admitting: Orthopaedic Surgery

## 2022-12-27 VITALS — BP 155/105 | HR 82 | Ht 63.0 in | Wt 148.0 lb

## 2022-12-27 DIAGNOSIS — M25512 Pain in left shoulder: Secondary | ICD-10-CM

## 2022-12-27 DIAGNOSIS — M25511 Pain in right shoulder: Secondary | ICD-10-CM

## 2022-12-27 DIAGNOSIS — M25412 Effusion, left shoulder: Secondary | ICD-10-CM

## 2022-12-27 MED ORDER — IBUPROFEN 800 MG PO TABS
800.0000 mg | ORAL_TABLET | Freq: Three times a day (TID) | ORAL | 5 refills | Status: AC | PRN
Start: 2022-12-27 — End: ?

## 2022-12-27 MED ORDER — HYDROCODONE-ACETAMINOPHEN 5-325 MG PO TABS: ORAL_TABLET | ORAL | 0 refills | Status: DC

## 2022-12-27 MED ORDER — METHYLPREDNISOLONE ACETATE 40 MG/ML IJ SUSP
40.0000 mg | Freq: Once | INTRAMUSCULAR | Status: AC
  Administered 2022-12-27: 40 mg via INTRA_ARTICULAR

## 2022-12-27 MED ORDER — IBUPROFEN 800 MG PO TABS: 800.0000 mg | ORAL_TABLET | Freq: Three times a day (TID) | ORAL | 5 refills | Status: DC | PRN

## 2022-12-27 NOTE — Progress Notes (Signed)
Patient states she went to have the blood work done and started itching all over took 50 mg of Benadryl states no rash but itching all over

## 2022-12-27 NOTE — Progress Notes (Signed)
Subjective:    Patient ID: Sherry Fields, female    DOB: 08/07/1955, 67 y.o.   MRN: [id number]  HPI She has had pain and swelling of the shoulders, more on the left, for the last month or so.  This past weekend her left shoulder had significant swelling posteriorly but no redness.  She has no trauma, no numbness, no insect bite.  She has pain in moving her shoulders. She has tried ibuprofen 600 mgm three times a day and it helps.  She has used pain cream also.  She is not improving.   Review of Systems  Constitutional:  Positive for activity change.  Musculoskeletal:  Positive for arthralgias, joint swelling and myalgias.  All other systems reviewed and are negative. For Review of Systems, all other systems reviewed and are negative.  The following is a summary of the past history medically, past history surgically, known current medicines, social history and family history.  This information is gathered electronically by the computer from prior information and documentation.  I review this each visit and have found including this information at this point in the chart is beneficial and informative.   Past Medical History:  Diagnosis Date   Anxiety    Depression    Diabetes mellitus    Hyperlipidemia    Hypertension    Hypothyroid    Insomnia     Past Surgical History:  Procedure Laterality Date   BREAST BIOPSY Right 2020   neg   CHOLECYSTECTOMY N/A 10/17/2016   Procedure: LAPAROSCOPIC CHOLECYSTECTOMY  (no gram);  Surgeon: Leafy Ro, MD;  Location: [facility];  Service: General;  Laterality: N/A;   THYROIDECTOMY      Current Outpatient Medications on File Prior to Visit  Medication Sig Dispense Refill   atorvastatin (LIPITOR) 20 MG tablet Take 20 mg by mouth daily.     EPINEPHrine (EPIPEN) 0.3 mg/0.3 mL DEVI Inject 0.3 mg into the muscle once.       glipiZIDE (GLUCOTROL) 10 MG tablet Take 10 mg by mouth daily.     JARDIANCE 10 MG TABS tablet Take 10 mg by mouth daily.      levothyroxine (SYNTHROID) 88 MCG tablet Take 88 mcg by mouth daily before breakfast.     loperamide (IMODIUM) 2 MG capsule Take 1 capsule (2 mg total) by mouth 4 (four) times daily as needed for diarrhea or loose stools. 12 capsule 0   losartan (COZAAR) 50 MG tablet Take 50 mg by mouth at bedtime.       omeprazole (PRILOSEC) 40 MG capsule Take 40 mg by mouth as needed.     SYNJARDY 12.11-998 MG TABS Take 1 tablet by mouth 2 (two) times daily.     zolpidem (AMBIEN) 10 MG tablet Take 10 mg by mouth at bedtime. For sleep     No current facility-administered medications on file prior to visit.    Social History   Socioeconomic History   Marital status: Widowed    Spouse name: Not on file   Number of children: 1   Years of education: 33   Highest education level: 12th grade  Occupational History   Not on file  Tobacco Use   Smoking status: Never    Passive exposure: Never   Smokeless tobacco: Current    Types: Snuff   Tobacco comments:    Tobacco Cessation Encouraged.  Vaping Use   Vaping Use: Never used  Substance and Sexual Activity   Alcohol use: No  Drug use: No   Sexual activity: Not Currently    Partners: Male  Other Topics Concern   Not on file  Social History Narrative   Not on file   Social Determinants of Health   Financial Resource Strain: Low Risk  (05/25/2022)   Overall Financial Resource Strain (CARDIA)    Difficulty of Paying Living Expenses: Not hard at all  Food Insecurity: No Food Insecurity (05/25/2022)   Hunger Vital Sign    Worried About Running Out of Food in the Last Year: Never true    Ran Out of Food in the Last Year: Never true  Transportation Needs: No Transportation Needs (05/25/2022)   PRAPARE - Administrator, Civil Service (Medical): No    Lack of Transportation (Non-Medical): No  Physical Activity: Inactive (05/25/2022)   Exercise Vital Sign    Days of Exercise per Week: 0 days    Minutes of Exercise per Session: 0 min   Stress: No Stress Concern Present (05/25/2022)   [employer] of Occupational Health - Occupational Stress Questionnaire    Feeling of Stress : Only a little  Social Connections: Moderately Integrated (05/25/2022)   Social Connection and Isolation Panel [NHANES]    Frequency of Communication with Friends and Family: More than three times a week    Frequency of Social Gatherings with Friends and Family: More than three times a week    Attends Religious Services: More than 4 times per year    Active Member of [employer] or Organizations: Yes    Attends Banker Meetings: More than 4 times per year    Marital Status: Widowed  Intimate Partner Violence: Not At Risk (05/25/2022)   Humiliation, Afraid, Rape, and Kick questionnaire    Fear of Current or Ex-Partner: No    Emotionally Abused: No    Physically Abused: No    Sexually Abused: No    Family History  Problem Relation Age of Onset   Coronary artery disease Mother    Coronary artery disease Father     BP (!) 155/105   Pulse 82   Ht 5\' 3"  (1.6 m)   Wt 148 lb (67.1 kg)   BMI 26.22 kg/m   Body mass index is 26.22 kg/m.      Objective:   Physical Exam Vitals and nursing note reviewed. Exam conducted with a chaperone present.  Constitutional:      Appearance: She is well-developed.  HENT:     Head: Normocephalic and atraumatic.  Eyes:     Conjunctiva/sclera: Conjunctivae normal.     Pupils: Pupils are equal, round, and reactive to light.  Cardiovascular:     Rate and Rhythm: Normal rate and regular rhythm.  Pulmonary:     Effort: Pulmonary effort is normal.  Abdominal:     Palpations: Abdomen is soft.  Musculoskeletal:       Arms:     Cervical back: Normal range of motion and neck supple.  Skin:    General: Skin is warm and dry.  Neurological:     Mental Status: She is alert and oriented to person, place, and time.     Cranial Nerves: No cranial nerve deficit.     Motor: No abnormal muscle tone.      Coordination: Coordination normal.     Deep Tendon Reflexes: Reflexes are normal and symmetric. Reflexes normal.  Psychiatric:        Behavior: Behavior normal.        Thought Content: Thought  content normal.        Judgment: Judgment normal.   X-rays were done of both shoulders, reported separately.        Assessment & Plan:   Encounter Diagnoses  Name Primary?   Acute pain of both shoulders Yes   Effusion of shoulder, left    PROCEDURE NOTE:  The patient request injection, verbal consent was obtained.  The left shoulder was prepped appropriately after time out was performed.   Sterile technique was observed and injection of 1 cc of DepoMedrol 40mg  with several cc's of plain xylocaine. Anesthesia was provided by ethyl chloride and a 20-gauge needle was used to inject the shoulder area. A posterior approach was used.  The injection was tolerated well.  A band aid dressing was applied.  The patient was advised to apply ice later today and tomorrow to the injection sight as needed.  PROCEDURE NOTE:  The patient request injection, verbal consent was obtained.  The right shoulder was prepped appropriately after time out was performed.   Sterile technique was observed and injection of 1 cc of DepoMedrol 40mg  with several cc's of plain xylocaine. Anesthesia was provided by ethyl chloride and a 20-gauge needle was used to inject the shoulder area. A posterior approach was used.  The injection was tolerated well.  A band aid dressing was applied.  The patient was advised to apply ice later today and tomorrow to the injection sight as needed.  I will get labs for RA latex, ANA, sed rate, c-reactive protein and CBC.  Return in two weeks.  I will give Rx for ibuprofen 800.    Call if any problem.  Precautions discussed.  Electronically Signed Darreld Mclean, MD 5/28/20242:51 PM  She returned and said she thinks she had a reaction to the injections.  She is itching.   She has no shortness of breath, no redness, no swelling.  She has taken benadryl.  I will have her wait here in the office for a while and watch her.  Electronically Signed Darreld Mclean, MD 5/28/20243:08 PM

## 2022-12-27 NOTE — Telephone Encounter (Signed)
Sherry Fields had an allergic reaction to the injection, tongue numb, feels funny, and now hives.  Went to get blood drawn, took benadryl, I have advised her to come back to office for Dr Kirtland Bouchard to eval.

## 2022-12-27 NOTE — Telephone Encounter (Signed)
[facility] does not take patient's insurance, can you please resend to [facility]?

## 2022-12-30 DIAGNOSIS — M25511 Pain in right shoulder: Secondary | ICD-10-CM | POA: Diagnosis not present

## 2022-12-30 DIAGNOSIS — M25512 Pain in left shoulder: Secondary | ICD-10-CM | POA: Diagnosis not present

## 2022-12-30 DIAGNOSIS — M25412 Effusion, left shoulder: Secondary | ICD-10-CM | POA: Diagnosis not present

## 2023-01-01 ENCOUNTER — Encounter: Payer: Self-pay | Admitting: Orthopaedic Surgery

## 2023-01-01 LAB — CBC WITH DIFFERENTIAL/PLATELET
Absolute Monocytes: 896 cells/uL (ref 200–950)
Basophils Absolute: 45 cells/uL (ref 0–200)
Basophils Relative: 0.4 %
Eosinophils Absolute: 168 cells/uL (ref 15–500)
Eosinophils Relative: 1.5 %
HCT: 40.7 % (ref 35.0–45.0)
Hemoglobin: 13.8 g/dL (ref 11.7–15.5)
Lymphs Abs: 3718 cells/uL (ref 850–3900)
MCH: 29.1 pg (ref 27.0–33.0)
MCHC: 33.9 g/dL (ref 32.0–36.0)
MCV: 85.9 fL (ref 80.0–100.0)
MPV: 9.7 fL (ref 7.5–12.5)
Monocytes Relative: 8 %
Neutro Abs: 6373 cells/uL (ref 1500–7800)
Neutrophils Relative %: 56.9 %
Platelets: 281 10*3/uL (ref 140–400)
RBC: 4.74 10*6/uL (ref 3.80–5.10)
RDW: 13 % (ref 11.0–15.0)
Total Lymphocyte: 33.2 %
WBC: 11.2 10*3/uL — ABNORMAL HIGH (ref 3.8–10.8)

## 2023-01-01 LAB — COMPREHENSIVE METABOLIC PANEL
AG Ratio: 1.8 (calc) (ref 1.0–2.5)
ALT: 11 U/L (ref 6–29)
AST: 11 U/L (ref 10–35)
Albumin: 4.4 g/dL (ref 3.6–5.1)
Alkaline phosphatase (APISO): 54 U/L (ref 37–153)
BUN: 17 mg/dL (ref 7–25)
CO2: 24 mmol/L (ref 20–32)
Calcium: 8.6 mg/dL (ref 8.6–10.4)
Chloride: 104 mmol/L (ref 98–110)
Creat: 0.9 mg/dL (ref 0.50–1.05)
Globulin: 2.4 g/dL (calc) (ref 1.9–3.7)
Glucose, Bld: 284 mg/dL — ABNORMAL HIGH (ref 65–99)
Potassium: 3.3 mmol/L — ABNORMAL LOW (ref 3.5–5.3)
Sodium: 141 mmol/L (ref 135–146)
Total Bilirubin: 0.4 mg/dL (ref 0.2–1.2)
Total Protein: 6.8 g/dL (ref 6.1–8.1)

## 2023-01-01 LAB — SEDIMENTATION RATE: Sed Rate: 6 mm/h (ref 0–30)

## 2023-01-01 LAB — C-REACTIVE PROTEIN: CRP: <3 mg/L (ref <8.0)

## 2023-01-01 LAB — ANA: Anti Nuclear Antibody (ANA): NEGATIVE

## 2023-01-01 LAB — RHEUMATOID FACTOR: Rheumatoid fact SerPl-aCnc: <10 IU/mL (ref <14)

## 2023-01-10 ENCOUNTER — Ambulatory Visit (INDEPENDENT_AMBULATORY_CARE_PROVIDER_SITE_OTHER): Payer: 59 | Admitting: Orthopaedic Surgery

## 2023-01-10 ENCOUNTER — Encounter: Payer: Self-pay | Admitting: Orthopaedic Surgery

## 2023-01-10 DIAGNOSIS — M25512 Pain in left shoulder: Secondary | ICD-10-CM

## 2023-01-10 DIAGNOSIS — M25511 Pain in right shoulder: Secondary | ICD-10-CM | POA: Diagnosis not present

## 2023-01-10 DIAGNOSIS — G8929 Other chronic pain: Secondary | ICD-10-CM

## 2023-01-10 MED ORDER — HYDROCODONE-ACETAMINOPHEN 5-325 MG PO TABS: 1.0000 | ORAL_TABLET | Freq: Four times a day (QID) | ORAL | 0 refills | Status: AC | PRN

## 2023-01-10 NOTE — Progress Notes (Signed)
My shoulders still hurt.  She has pain in both shoulders, worse on the right.  She has no new trauma.  She has been taking the ibuprofen with some help.  The injections helped some as well.  She awakens because of right shoulder pain.  Both shoulders have forward of 150, right abduction 130, left 150; extension both 5; internal right 20, left 30; external right 15, left 30; adduction full both; NV intact.  Her arthritis profile was negative; blood sugar was 294 and potassium was 3.3.  Encounter Diagnoses  Name Primary?   Chronic right shoulder pain Yes   Chronic pain in left shoulder    I will get MRI of the right shoulder.  Continue the ibuprofen.  I will add Norco.  I have reviewed the [place] Controlled Substance Reporting System web site prior to prescribing narcotic medicine for this patient.  Return in three weeks.  Call if any problem.  Precautions discussed.  Electronically Signed Darreld Mclean, MD 6/11/20241:47 PM

## 2023-01-18 ENCOUNTER — Ambulatory Visit
Admission: RE | Admit: 2023-01-18 | Discharge: 2023-01-18 | Disposition: A | Discharge status: Home / Self Care | Payer: 59 | Source: Ambulatory Visit | Attending: Orthopaedic Surgery | Admitting: Orthopaedic Surgery

## 2023-01-18 DIAGNOSIS — M25511 Pain in right shoulder: Secondary | ICD-10-CM | POA: Insufficient documentation

## 2023-01-18 DIAGNOSIS — G8929 Other chronic pain: Secondary | ICD-10-CM | POA: Insufficient documentation

## 2023-01-19 DIAGNOSIS — E039 Hypothyroidism, unspecified: Secondary | ICD-10-CM | POA: Diagnosis not present

## 2023-01-19 DIAGNOSIS — E782 Mixed hyperlipidemia: Secondary | ICD-10-CM | POA: Diagnosis not present

## 2023-01-19 DIAGNOSIS — E1165 Type 2 diabetes mellitus with hyperglycemia: Secondary | ICD-10-CM | POA: Diagnosis not present

## 2023-01-24 ENCOUNTER — Ambulatory Visit: Payer: 59 | Admitting: Orthopaedic Surgery

## 2023-01-26 DIAGNOSIS — R0781 Pleurodynia: Secondary | ICD-10-CM | POA: Diagnosis not present

## 2023-01-26 DIAGNOSIS — R051 Acute cough: Secondary | ICD-10-CM | POA: Diagnosis not present

## 2023-01-26 DIAGNOSIS — K219 Gastro-esophageal reflux disease without esophagitis: Secondary | ICD-10-CM | POA: Diagnosis not present

## 2023-01-26 DIAGNOSIS — E876 Hypokalemia: Secondary | ICD-10-CM | POA: Diagnosis not present

## 2023-01-26 DIAGNOSIS — E782 Mixed hyperlipidemia: Secondary | ICD-10-CM | POA: Diagnosis not present

## 2023-01-26 DIAGNOSIS — I10 Essential (primary) hypertension: Secondary | ICD-10-CM | POA: Diagnosis not present

## 2023-01-26 DIAGNOSIS — E1165 Type 2 diabetes mellitus with hyperglycemia: Secondary | ICD-10-CM | POA: Diagnosis not present

## 2023-01-26 DIAGNOSIS — E039 Hypothyroidism, unspecified: Secondary | ICD-10-CM | POA: Diagnosis not present

## 2023-02-06 ENCOUNTER — Ambulatory Visit (HOSPITAL_COMMUNITY): Payer: 59

## 2023-02-07 ENCOUNTER — Encounter: Payer: Self-pay | Admitting: Orthopaedic Surgery

## 2023-02-07 ENCOUNTER — Ambulatory Visit (INDEPENDENT_AMBULATORY_CARE_PROVIDER_SITE_OTHER): Payer: 59 | Admitting: Orthopaedic Surgery

## 2023-02-07 DIAGNOSIS — M25511 Pain in right shoulder: Secondary | ICD-10-CM | POA: Diagnosis not present

## 2023-02-07 DIAGNOSIS — G8929 Other chronic pain: Secondary | ICD-10-CM

## 2023-02-07 MED ORDER — TRAMADOL HCL 50 MG PO TABS: 50.0000 mg | ORAL_TABLET | Freq: Four times a day (QID) | ORAL | 2 refills | Status: AC | PRN

## 2023-02-07 NOTE — Addendum Note (Signed)
Addended by: Michaele Offer on: 02/07/2023 04:14 PM   Modules accepted: Orders

## 2023-02-07 NOTE — Progress Notes (Signed)
My shoulder is sore.  She had the MRI of the right shoulder showing: IMPRESSION: 1. Mild tendinosis of the supraspinatus and infraspinatus tendons. 2. Mild tendinosis of the subscapularis tendon. 3. Mild tendinosis of the intra-articular portion of the long head of the biceps tendon.  I have explained the findings to her.  She does not need surgery.  I have independently reviewed the MRI.     ROM of the right shoulder lacks full overhead flexion and abduction only to 110.  NV intact.  ROM neck is full and grips are normal.   Encounter Diagnosis  Name Primary?   Chronic right shoulder pain Yes   I will give Ultram for pain.  I have reviewed the [place] Controlled Substance Reporting System web site prior to prescribing narcotic medicine for this patient.  Return in one month.  Begin OT.  Call if any problem.  Precautions discussed.  Electronically Signed Darreld Mclean, MD 7/9/20241:40 PM

## 2023-02-16 ENCOUNTER — Encounter (HOSPITAL_COMMUNITY): Payer: Self-pay | Admitting: Occupational Therapy

## 2023-02-16 ENCOUNTER — Other Ambulatory Visit: Payer: Self-pay

## 2023-02-16 ENCOUNTER — Ambulatory Visit (HOSPITAL_COMMUNITY): Payer: 59 | Attending: Orthopaedic Surgery | Admitting: Occupational Therapy

## 2023-02-16 DIAGNOSIS — R29898 Other symptoms and signs involving the musculoskeletal system: Secondary | ICD-10-CM | POA: Diagnosis not present

## 2023-02-16 DIAGNOSIS — M25511 Pain in right shoulder: Secondary | ICD-10-CM | POA: Diagnosis not present

## 2023-02-16 DIAGNOSIS — M25611 Stiffness of right shoulder, not elsewhere classified: Secondary | ICD-10-CM | POA: Insufficient documentation

## 2023-02-16 DIAGNOSIS — G8929 Other chronic pain: Secondary | ICD-10-CM | POA: Diagnosis not present

## 2023-02-16 NOTE — Therapy (Signed)
OUTPATIENT OCCUPATIONAL THERAPY ORTHO EVALUATION  Patient Name: Sherry Fields MRN: [id number] DOB:[birth year], 67 y.o., female Today's Date: 02/17/2023  PCP: Nita Sells, MD REFERRING PROVIDER: Darreld Mclean, MD  END OF SESSION:  OT End of Session - 02/17/23 0738     Visit Number 1    Number of Visits 7    Date for OT Re-Evaluation 04/07/23    Authorization Type [employer] Dual Complete    OT Start Time 0951    OT Stop Time 1019    OT Time Calculation (min) 28 min    Activity Tolerance Patient tolerated treatment well    Behavior During Therapy WFL for tasks assessed/performed             Past Medical History:  Diagnosis Date   Anxiety    Depression    Diabetes mellitus    Hyperlipidemia    Hypertension    Hypothyroid    Insomnia    Past Surgical History:  Procedure Laterality Date   BREAST BIOPSY Right 2020   neg   CHOLECYSTECTOMY N/A 10/17/2016   Procedure: LAPAROSCOPIC CHOLECYSTECTOMY  (no gram);  Surgeon: Leafy Ro, MD;  Location: [facility] ORS;  Service: General;  Laterality: N/A;   THYROIDECTOMY     Patient Active Problem List   Diagnosis Date Noted   Diabetes 1.5, managed as type 1 (HCC) 09/29/2014   Diabetes mellitus    Hypertension    Hypothyroid    Insomnia    Hyperlipidemia     ONSET DATE: ~4 months  REFERRING DIAG: Chronic R Shoulder Pain  THERAPY DIAG:  Chronic right shoulder pain  Shoulder stiffness, right  Other symptoms and signs involving the musculoskeletal system  Rationale for Evaluation and Treatment: Rehabilitation  SUBJECTIVE:   SUBJECTIVE STATEMENT: "I don't know what's  going on but it hurts. Pt accompanied by: self  PERTINENT HISTORY: Patient has been dealing with chronic shoulder pain for 4+ months. She reports no falls or accidents leading to shoulder pain. MRI indicates mild to moderate tendinitis. Pt's chart reports no other significant medical history.   PRECAUTIONS: None  WEIGHT BEARING RESTRICTIONS: No  PAIN:   Are you having pain? Yes: NPRS scale: 1/10 Pain location: Posterior shoulder girdle Pain description: sore Aggravating factors: "nothing" It comes and goes Relieving factors: tylenol and ibuprofen  FALLS: Has patient fallen in last 6 months? No  LIVING ENVIRONMENT: Lives with: lives with their family and lives with their daughter Lives in: Mobile home  PLOF: Independent  PATIENT GOALS: I want more strength in my arm.   NEXT MD VISIT: 03/10/23  OBJECTIVE:   HAND DOMINANCE: Right  ADLs: Overall ADLs: Pt has difficulty reaching overhead and behind back during dressing, bathing, and grooming. Pt reports pain with lifting heavier items during cooking, cleaning, and other IADL's.   FUNCTIONAL OUTCOME MEASURES: FOTO: 60.50  UPPER EXTREMITY ROM:     Active ROM Right eval  Shoulder flexion 127  Shoulder abduction 145  Shoulder internal rotation 90  Shoulder external rotation 28  (Blank rows = not tested)  UPPER EXTREMITY MMT:     MMT Right eval  Shoulder flexion 4-/5  Shoulder abduction 4/5  Shoulder adduction 4-/5  Shoulder extension 4/5  Shoulder internal rotation 4/5  Shoulder external rotation 4/5  (Blank rows = not tested)  SENSATION: WFL  EDEMA: No swelling noted  OBSERVATIONS: Moderate fascial restrictions noted along the scapular region, trapezius, and posterior shoulder girdle   TODAY'S TREATMENT:  DATE: 02/16/23: Evaluation Only    PATIENT EDUCATION: Education details: [employer] and A/ROM Person educated: Patient Education method: Explanation, Demonstration, and Handouts Education comprehension: verbalized understanding and returned demonstration  HOME EXERCISE PROGRAM: 7/18: Wall Slides and A/ROM  GOALS: Goals reviewed with patient? Yes  SHORT TERM GOALS: Target date: 04/07/23  Pt will be provided and educated on an  HEP for RUE mobility and ADL completion.   Goal status: INITIAL  2.  Pt will decrease pain in RUE to 2/10 in order to sleep for 3+ consecutive hours without waking due to pain.   Goal status: INITIAL  3.  Pt will decrease RUE fascial restrictions to minimal amounts or less, in order to complete reaching tasks required during IADL's.   Goal status: INITIAL  4.  Pt will increase RUE ROM to [facility] in order to reach overhead and behind her back in order to complete dressing and bathing tasks.   Goal status: INITIAL  5.  Pt will increase strength in RUE to 4+/5 in order to lift and carry items during cooking/cleaning/yard work tasks   Goal status: INITIAL   ASSESSMENT:  CLINICAL IMPRESSION: Patient is a 67 y.o. female who was seen today for occupational therapy evaluation for chronic R shoulder pain. Pt demonstrating increased pain and decreased mobility, limiting her ability to complete ADL's and IADL's without compensatory strategies and in an adequate amount of time.    PERFORMANCE DEFICITS: in functional skills including ADLs, IADLs, ROM, strength, pain, fascial restrictions, muscle spasms, Gross motor control, body mechanics, and UE functional use.  IMPAIRMENTS: are limiting patient from ADLs, IADLs, rest and sleep, leisure, and social participation.   COMORBIDITIES: has no other co-morbidities that affects occupational performance. Patient will benefit from skilled OT to address above impairments and improve overall function.  MODIFICATION OR ASSISTANCE TO COMPLETE EVALUATION: No modification of tasks or assist necessary to complete an evaluation.  OT OCCUPATIONAL PROFILE AND HISTORY: Problem focused assessment: Including review of records relating to presenting problem.  CLINICAL DECISION MAKING: LOW - limited treatment options, no task modification necessary  REHAB POTENTIAL: Good  EVALUATION COMPLEXITY: Low      PLAN:  OT FREQUENCY: 1-2x/week  OT DURATION: 6  weeks  PLANNED INTERVENTIONS: self care/ADL training, therapeutic exercise, therapeutic activity, manual therapy, passive range of motion, functional mobility training, electrical stimulation, moist heat, patient/family education, coping strategies training, and DME and/or AE instructions  RECOMMENDED OTHER SERVICES: N/A  CONSULTED AND AGREED WITH PLAN OF CARE: Patient  PLAN FOR NEXT SESSION: Manual therapy, P/ROM, AA/ROM, A/ROM, strengthening tasks   Trish Mage, OTR/L [facility] [phone] Kennyth Arnold, OT 02/17/2023, 7:39 AM

## 2023-02-16 NOTE — Patient Instructions (Signed)

## 2023-03-02 ENCOUNTER — Encounter (HOSPITAL_COMMUNITY): Payer: 59 | Admitting: Occupational Therapy

## 2023-03-07 ENCOUNTER — Encounter: Payer: Self-pay | Admitting: Orthopaedic Surgery

## 2023-03-07 ENCOUNTER — Ambulatory Visit (INDEPENDENT_AMBULATORY_CARE_PROVIDER_SITE_OTHER): Payer: 59 | Admitting: Orthopaedic Surgery

## 2023-03-07 VITALS — BP 103/71 | HR 67 | Ht 63.0 in | Wt 144.0 lb

## 2023-03-07 DIAGNOSIS — M25511 Pain in right shoulder: Secondary | ICD-10-CM | POA: Diagnosis not present

## 2023-03-07 DIAGNOSIS — G8929 Other chronic pain: Secondary | ICD-10-CM | POA: Diagnosis not present

## 2023-03-07 MED ORDER — TRAMADOL HCL 50 MG PO TABS: 50.0000 mg | ORAL_TABLET | Freq: Four times a day (QID) | ORAL | 3 refills | Status: AC | PRN

## 2023-03-07 NOTE — Progress Notes (Signed)
My shoulder is better.  She went to OT once and got the exercises to do and has been doing them.  She has no new trauma. She is better. She has pain more at the end of the day.  ROM of the right shoulder is nearly full.  NV intact. ROM of neck is full.  Encounter Diagnosis  Name Primary?   Chronic right shoulder pain Yes   I will refill Tramadol.  I have reviewed the [place] Controlled Substance Reporting System web site prior to prescribing narcotic medicine for this patient.  Return in six weeks.  Continue exercises at home.  Call if any problem.  Precautions discussed.  Electronically Signed Darreld Mclean, MD 8/6/20242:46 PM

## 2023-03-16 ENCOUNTER — Encounter (HOSPITAL_COMMUNITY): Payer: 59 | Admitting: Occupational Therapy

## 2023-03-23 ENCOUNTER — Encounter (HOSPITAL_COMMUNITY): Payer: 59 | Admitting: Occupational Therapy

## 2023-03-30 ENCOUNTER — Encounter (HOSPITAL_COMMUNITY): Payer: 59 | Admitting: Occupational Therapy

## 2023-04-06 ENCOUNTER — Encounter (HOSPITAL_COMMUNITY): Payer: 59 | Admitting: Occupational Therapy

## 2023-04-13 ENCOUNTER — Encounter (HOSPITAL_COMMUNITY): Payer: 59 | Admitting: Occupational Therapy

## 2023-04-18 ENCOUNTER — Ambulatory Visit: Payer: 59 | Admitting: Orthopaedic Surgery

## 2023-04-24 NOTE — Therapy (Signed)
[facility] at [facility] [address] [place], [place], [address] Phone: [phone]   Fax:  [phone]  April 24, 2023    No Recipients  Occupational Therapy Discharge Summary   Patient: Sherry Fields MRN: [id number] Date of Birth: [birth year]  Diagnosis: Chronic right shoulder pain - Plan: Ot plan of care cert/re-cert  Shoulder stiffness, right - Plan: Ot plan of care cert/re-cert  Other symptoms and signs involving the musculoskeletal system - Plan: Ot plan of care cert/re-cert  No data recorded  The above patient had been seen in Occupational Therapy 1 time for OT Eval with all follow up visits canceled by pt. Pt will be discharged from OT as she has not followed up further and reported to MD that she feels much better after the evaluation.   [place] Bing Plume, OTR/L [facility] [phone] Zya Finkle Rosemarie Beath, OT  CC No Recipients  [facility] Outpatient Rehabilitation at [facility] [address] [place], [place], [address] Phone: [phone]   Fax:  [phone]  Patient: Sherry Fields MRN: [id number] Date of Birth: Jan 24, 1956

## 2023-08-03 DIAGNOSIS — E782 Mixed hyperlipidemia: Secondary | ICD-10-CM | POA: Diagnosis not present

## 2023-08-03 DIAGNOSIS — E039 Hypothyroidism, unspecified: Secondary | ICD-10-CM | POA: Diagnosis not present

## 2023-08-03 DIAGNOSIS — E1165 Type 2 diabetes mellitus with hyperglycemia: Secondary | ICD-10-CM | POA: Diagnosis not present

## 2023-08-10 ENCOUNTER — Other Ambulatory Visit: Payer: Self-pay | Admitting: Family Medicine

## 2023-08-10 DIAGNOSIS — R110 Nausea: Secondary | ICD-10-CM | POA: Diagnosis not present

## 2023-08-10 DIAGNOSIS — E876 Hypokalemia: Secondary | ICD-10-CM | POA: Diagnosis not present

## 2023-08-10 DIAGNOSIS — R051 Acute cough: Secondary | ICD-10-CM | POA: Diagnosis not present

## 2023-08-10 DIAGNOSIS — Z1231 Encounter for screening mammogram for malignant neoplasm of breast: Secondary | ICD-10-CM | POA: Diagnosis not present

## 2023-08-10 DIAGNOSIS — I10 Essential (primary) hypertension: Secondary | ICD-10-CM | POA: Diagnosis not present

## 2023-08-10 DIAGNOSIS — E039 Hypothyroidism, unspecified: Secondary | ICD-10-CM | POA: Diagnosis not present

## 2023-08-10 DIAGNOSIS — E1165 Type 2 diabetes mellitus with hyperglycemia: Secondary | ICD-10-CM | POA: Diagnosis not present

## 2023-08-10 DIAGNOSIS — E782 Mixed hyperlipidemia: Secondary | ICD-10-CM | POA: Diagnosis not present

## 2023-08-10 DIAGNOSIS — K219 Gastro-esophageal reflux disease without esophagitis: Secondary | ICD-10-CM | POA: Diagnosis not present

## 2023-08-16 DIAGNOSIS — Z1211 Encounter for screening for malignant neoplasm of colon: Secondary | ICD-10-CM | POA: Diagnosis not present

## 2023-08-28 LAB — COLOGUARD: COLOGUARD: POSITIVE — AB

## 2023-08-28 LAB — EXTERNAL GENERIC LAB PROCEDURE: COLOGUARD: POSITIVE — AB

## 2023-08-29 ENCOUNTER — Telehealth: Payer: Self-pay

## 2023-08-29 NOTE — Telephone Encounter (Signed)
The patient and her daughter called in to check on her referral. She wanted to schedule her mother for appointment. I inform her that we didn't receive the referral and she will have to call the PCP office. I gave her the fax number to our practice.

## 2023-08-31 DIAGNOSIS — R112 Nausea with vomiting, unspecified: Secondary | ICD-10-CM | POA: Diagnosis not present

## 2023-08-31 DIAGNOSIS — K219 Gastro-esophageal reflux disease without esophagitis: Secondary | ICD-10-CM | POA: Diagnosis not present

## 2023-08-31 DIAGNOSIS — R194 Change in bowel habit: Secondary | ICD-10-CM | POA: Diagnosis not present

## 2023-08-31 DIAGNOSIS — R195 Other fecal abnormalities: Secondary | ICD-10-CM | POA: Diagnosis not present

## 2023-11-23 ENCOUNTER — Telehealth: Payer: Self-pay | Admitting: Orthopaedic Surgery

## 2023-11-23 NOTE — Telephone Encounter (Signed)
 error

## 2023-11-30 ENCOUNTER — Ambulatory Visit: Admitting: Orthopaedic Surgery

## 2023-11-30 ENCOUNTER — Encounter: Payer: Self-pay | Admitting: Gastroenterology

## 2023-12-07 ENCOUNTER — Encounter: Payer: Self-pay | Admitting: Gastroenterology

## 2023-12-07 ENCOUNTER — Ambulatory Visit
Admission: RE | Admit: 2023-12-07 | Discharge: 2023-12-07 | Disposition: A | Discharge status: Home / Self Care | Payer: 59 | Attending: Gastroenterology | Admitting: Gastroenterology

## 2023-12-07 ENCOUNTER — Ambulatory Visit: Admitting: Certified Registered"

## 2023-12-07 ENCOUNTER — Encounter
Admission: RE | Disposition: A | Discharge status: Home / Self Care | Payer: Self-pay | Source: Home / Self Care | Attending: Gastroenterology

## 2023-12-07 DIAGNOSIS — D124 Benign neoplasm of descending colon: Secondary | ICD-10-CM | POA: Diagnosis not present

## 2023-12-07 DIAGNOSIS — K621 Rectal polyp: Secondary | ICD-10-CM | POA: Insufficient documentation

## 2023-12-07 DIAGNOSIS — K2950 Unspecified chronic gastritis without bleeding: Secondary | ICD-10-CM | POA: Diagnosis not present

## 2023-12-07 DIAGNOSIS — I10 Essential (primary) hypertension: Secondary | ICD-10-CM | POA: Diagnosis not present

## 2023-12-07 DIAGNOSIS — D120 Benign neoplasm of cecum: Secondary | ICD-10-CM | POA: Insufficient documentation

## 2023-12-07 DIAGNOSIS — Z7984 Long term (current) use of oral hypoglycemic drugs: Secondary | ICD-10-CM | POA: Diagnosis not present

## 2023-12-07 DIAGNOSIS — K3189 Other diseases of stomach and duodenum: Secondary | ICD-10-CM | POA: Diagnosis not present

## 2023-12-07 DIAGNOSIS — K219 Gastro-esophageal reflux disease without esophagitis: Secondary | ICD-10-CM | POA: Insufficient documentation

## 2023-12-07 DIAGNOSIS — K6389 Other specified diseases of intestine: Secondary | ICD-10-CM | POA: Diagnosis not present

## 2023-12-07 DIAGNOSIS — E119 Type 2 diabetes mellitus without complications: Secondary | ICD-10-CM | POA: Insufficient documentation

## 2023-12-07 DIAGNOSIS — K5730 Diverticulosis of large intestine without perforation or abscess without bleeding: Secondary | ICD-10-CM | POA: Diagnosis not present

## 2023-12-07 DIAGNOSIS — R195 Other fecal abnormalities: Secondary | ICD-10-CM | POA: Insufficient documentation

## 2023-12-07 DIAGNOSIS — Z1211 Encounter for screening for malignant neoplasm of colon: Secondary | ICD-10-CM | POA: Insufficient documentation

## 2023-12-07 DIAGNOSIS — K641 Second degree hemorrhoids: Secondary | ICD-10-CM | POA: Diagnosis not present

## 2023-12-07 DIAGNOSIS — K649 Unspecified hemorrhoids: Secondary | ICD-10-CM | POA: Diagnosis not present

## 2023-12-07 DIAGNOSIS — K2970 Gastritis, unspecified, without bleeding: Secondary | ICD-10-CM | POA: Diagnosis not present

## 2023-12-07 DIAGNOSIS — K635 Polyp of colon: Secondary | ICD-10-CM | POA: Diagnosis not present

## 2023-12-07 HISTORY — PX: ESOPHAGOGASTRODUODENOSCOPY (EGD) WITH PROPOFOL: SHX5813

## 2023-12-07 HISTORY — DX: Thyrotoxicosis with toxic multinodular goiter without thyrotoxic crisis or storm: E05.20

## 2023-12-07 HISTORY — PX: POLYPECTOMY: SHX149

## 2023-12-07 HISTORY — PX: HEMOSTASIS CLIP PLACEMENT: SHX6857

## 2023-12-07 HISTORY — DX: Epilepsy, unspecified, not intractable, without status epilepticus: G40.909

## 2023-12-07 HISTORY — PX: COLONOSCOPY WITH PROPOFOL: SHX5780

## 2023-12-07 LAB — GLUCOSE, CAPILLARY: Glucose-Capillary: 192 mg/dL — ABNORMAL HIGH (ref 70–99)

## 2023-12-07 SURGERY — COLONOSCOPY WITH PROPOFOL
Anesthesia: General

## 2023-12-07 MED ORDER — PROPOFOL 500 MG/50ML IV EMUL
INTRAVENOUS | Status: DC | PRN
  Administered 2023-12-07: 145 ug/kg/min via INTRAVENOUS

## 2023-12-07 MED ORDER — MIDAZOLAM HCL 2 MG/2ML IJ SOLN
INTRAMUSCULAR | Status: AC
  Filled 2023-12-07: qty 2

## 2023-12-07 MED ORDER — LIDOCAINE HCL (CARDIAC) PF 100 MG/5ML IV SOSY
PREFILLED_SYRINGE | INTRAVENOUS | Status: DC | PRN
  Administered 2023-12-07: 100 mg via INTRAVENOUS

## 2023-12-07 MED ORDER — STERILE WATER FOR IRRIGATION IR SOLN
Status: DC | PRN
  Administered 2023-12-07: 120 mL

## 2023-12-07 MED ORDER — SODIUM CHLORIDE 0.9 % IV SOLN: INTRAVENOUS | Status: DC | PRN

## 2023-12-07 MED ORDER — MIDAZOLAM HCL 2 MG/2ML IJ SOLN
INTRAMUSCULAR | Status: DC | PRN
  Administered 2023-12-07: 2 mg via INTRAVENOUS

## 2023-12-07 MED ORDER — GLYCOPYRROLATE 0.2 MG/ML IJ SOLN
INTRAMUSCULAR | Status: DC | PRN
  Administered 2023-12-07: .2 mg via INTRAVENOUS

## 2023-12-07 MED ORDER — PROPOFOL 10 MG/ML IV BOLUS
INTRAVENOUS | Status: DC | PRN
  Administered 2023-12-07: 30 mg via INTRAVENOUS
  Administered 2023-12-07: 70 mg via INTRAVENOUS

## 2023-12-07 NOTE — Op Note (Signed)
 [facility] Patient Name: Sherry Fields Procedure Date: 12/07/2023 1:53 PM MRN: [id number] Account #: [id number] Date of Birth: Nov 09, 1955 Admit Type: Outpatient Age: 68 Room: [facility] ENDO ROOM 1 Gender: Female Note Status: Finalized Instrument Name: Peds Colonoscope [id number] Procedure:             Colonoscopy Indications:           Positive Cologuard test, Change in bowel habits Providers:             [name] DO, DO Medicines:             Monitored Anesthesia Care Complications:         No immediate complications. Estimated blood loss:                         Minimal. Procedure:             Pre-Anesthesia Assessment:                        - Prior to the procedure, a History and Physical was                         performed, and patient medications and allergies were                         reviewed. The patient is competent. The risks and                         benefits of the procedure and the sedation options and                         risks were discussed with the patient. All questions                         were answered and informed consent was obtained.                         Patient identification and proposed procedure were                         verified by the physician, the nurse, the anesthetist                         and the technician. Mental Status Examination: alert                         and oriented. Airway Examination: normal oropharyngeal                         airway and neck mobility. Respiratory Examination:                         clear to auscultation. CV Examination: RRR, no                         murmurs, no S3 or S4. Prophylactic Antibiotics: The                         patient does not require  prophylactic antibiotics.                         Prior Anticoagulants: The patient has taken no                         anticoagulant or antiplatelet agents. ASA Grade                         Assessment: II -  A patient with mild systemic disease.                         After reviewing the risks and benefits, the patient                         was deemed in satisfactory condition to undergo the                         procedure. The anesthesia plan was to use monitored                         anesthesia care (MAC). Immediately prior to                         administration of medications, the patient was                         re-assessed for adequacy to receive sedatives. The                         heart rate, respiratory rate, oxygen saturations,                         blood pressure, adequacy of pulmonary ventilation, and                         response to care were monitored throughout the                         procedure. The physical status of the patient was                         re-assessed after the procedure.                        After obtaining informed consent, the colonoscope was                         passed under direct vision. Throughout the procedure,                         the patient's blood pressure, pulse, and oxygen                         saturations were monitored continuously. The                         Colonoscope was introduced through the anus and  advanced to the the terminal ileum, with                         identification of the appendiceal orifice and IC                         valve. The colonoscopy was performed without                         difficulty. The patient tolerated the procedure well.                         The quality of the bowel preparation was evaluated                         using the BBPS [facility] Bowel Preparation Scale) with                         scores of: Right Colon = 2 (minor amount of residual                         staining, small fragments of stool and/or opaque                         liquid, but mucosa seen well), Transverse Colon = 2                         (minor amount of residual  staining, small fragments of                         stool and/or opaque liquid, but mucosa seen well) and                         Left Colon = 2 (minor amount of residual staining,                         small fragments of stool and/or opaque liquid, but                         mucosa seen well). The total BBPS score equals 6. The                         quality of the bowel preparation was good. The                         terminal ileum, ileocecal valve, appendiceal orifice,                         and rectum were photographed. Findings:      Hemorrhoids were found on perianal exam.      The digital rectal exam was normal. Pertinent negatives include normal       sphincter tone.      The terminal ileum appeared normal. Estimated blood loss: none.      Retroflexion in the right colon was performed.      Multiple small-mouthed diverticula were found in the entire colon.       Predominantly in the  left colon. Estimated blood loss: none.      Non-bleeding internal hemorrhoids were found during retroflexion. The       hemorrhoids were Grade II (internal hemorrhoids that prolapse but reduce       spontaneously). Estimated blood loss: none.      Three sessile polyps were found in the rectum (1) and cecum (2). The       polyps were 1 to 2 mm in size. These polyps were removed with a jumbo       cold forceps. Resection and retrieval were complete. Estimated blood       loss was minimal.      A 5 to 6 mm polyp was found in the descending colon. The polyp was       semi-pedunculated. The polyp was removed with a cold snare. Resection       and retrieval were complete. To prevent bleeding after the polypectomy,       two hemostatic clips were successfully placed (MR conditional). There       was no bleeding at the end of the procedure. Estimated blood loss was       minimal.      A 20 mm polypoid lesion was found in the cecum. The lesion was granular       lateral spreading. No bleeding was  present. Biopsies were taken with a       cold forceps for histology. Estimated blood loss was minimal.      The exam was otherwise without abnormality on direct and retroflexion       views.      The exam was otherwise without abnormality on direct and retroflexion       views. Impression:            - Hemorrhoids found on perianal exam.                        - The examined portion of the ileum was normal.                        - Diverticulosis in the entire examined colon.                        - Non-bleeding internal hemorrhoids.                        - Three 1 to 2 mm polyps in the rectum and in the                         cecum, removed with a jumbo cold forceps. Resected and                         retrieved.                        - One 5 to 6 mm polyp in the descending colon, removed                         with a cold snare. Resected and retrieved. Clips (MR                         conditional) were placed.                        -  Polypoid lesion in the cecum. Biopsied.                        - The examination was otherwise normal on direct and                         retroflexion views.                        - The examination was otherwise normal on direct and                         retroflexion views. Recommendation:        - Patient has a contact number available for                         emergencies. The signs and symptoms of potential                         delayed complications were discussed with the patient.                         Return to normal activities tomorrow. Written                         discharge instructions were provided to the patient.                        - Discharge patient to home.                        - Resume previous diet.                        - Continue present medications.                        - No ibuprofen , naproxen , or other non-steroidal                         anti-inflammatory drugs for 5 days after polyp removal.                         - Await pathology results.                        - Repeat colonoscopy at appointment to be scheduled                         with advanced endoscopy to attempt cecal lesion                         removal.                        - Return to referring physician as previously                         scheduled.                        - Refer  to Advanced Endoscopy at appointment to be                         scheduled.                        - The findings and recommendations were discussed with                         the patient.                        - The findings and recommendations were discussed with                         the patient's family. Procedure Code(s):     --- Professional ---                        [phone], Colonoscopy, flexible; with removal of                         tumor(s), polyp(s), or other lesion(s) by snare                         technique                        45380, 59, Colonoscopy, flexible; with biopsy, single                         or multiple Diagnosis Code(s):     --- Professional ---                        K64.1, Second degree hemorrhoids                        D12.8, Benign neoplasm of rectum                        D12.0, Benign neoplasm of cecum                        D12.4, Benign neoplasm of descending colon                        D49.0, Neoplasm of unspecified behavior of digestive                         system                        R19.5, Other fecal abnormalities                        R19.4, Change in bowel habit                        K57.30, Diverticulosis of large intestine without                         perforation or abscess without bleeding CPT copyright 2022 American Medical Association. All rights reserved. The codes documented in this  report are preliminary and upon coder review may  be revised to meet current compliance requirements. Attending Participation:      I personally performed the entire  procedure. Polo Brisk, DO [name] DO, DO 12/07/2023 2:56:13 PM This report has been signed electronically. Number of Addenda: 0 Note Initiated On: 12/07/2023 1:53 PM Scope Withdrawal Time: 0 hours 16 minutes 28 seconds  Total Procedure Duration: 0 hours 20 minutes 3 seconds  Estimated Blood Loss:  Estimated blood loss was minimal.      [facility]

## 2023-12-07 NOTE — Op Note (Addendum)
 [facility] Gastroenterology Patient Name: Sherry Fields Procedure Date: 12/07/2023 1:54 PM MRN: [id number] Account #: [id number] Date of Birth: Apr 10, 1956 Admit Type: Outpatient Age: 68 Room: [facility] ENDO ROOM 1 Gender: Female Note Status: Finalized Instrument Name: Upper Endoscope [id number] Procedure:             Upper GI endoscopy Indications:           Suspected gastro-esophageal reflux disease, Nausea                         with vomiting Providers:             [name] DO, DO Medicines:             Monitored Anesthesia Care Complications:         No immediate complications. Estimated blood loss:                         Minimal. Procedure:             Pre-Anesthesia Assessment:                        - Prior to the procedure, a History and Physical was                         performed, and patient medications and allergies were                         reviewed. The patient is competent. The risks and                         benefits of the procedure and the sedation options and                         risks were discussed with the patient. All questions                         were answered and informed consent was obtained.                         Patient identification and proposed procedure were                         verified by the physician, the nurse, the anesthetist                         and the technician in the endoscopy suite. Mental                         Status Examination: alert and oriented. Airway                         Examination: normal oropharyngeal airway and neck                         mobility. Respiratory Examination: clear to                         auscultation. CV Examination: RRR, no murmurs, no S3  or S4. Prophylactic Antibiotics: The patient does not                         require prophylactic antibiotics. Prior                         Anticoagulants: The patient has taken no anticoagulant                          or antiplatelet agents. ASA Grade Assessment: II - A                         patient with mild systemic disease. After reviewing                         the risks and benefits, the patient was deemed in                         satisfactory condition to undergo the procedure. The                         anesthesia plan was to use monitored anesthesia care                         (MAC). Immediately prior to administration of                         medications, the patient was re-assessed for adequacy                         to receive sedatives. The heart rate, respiratory                         rate, oxygen saturations, blood pressure, adequacy of                         pulmonary ventilation, and response to care were                         monitored throughout the procedure. The physical                         status of the patient was re-assessed after the                         procedure.                        After obtaining informed consent, the endoscope was                         passed under direct vision. Throughout the procedure,                         the patient's blood pressure, pulse, and oxygen                         saturations were monitored continuously. The  Endosonoscope was introduced through the mouth, and                         advanced to the second part of duodenum. The upper GI                         endoscopy was accomplished without difficulty. The                         patient tolerated the procedure well. Findings:      The duodenal bulb, first portion of the duodenum and second portion of       the duodenum were normal. Biopsies for histology were taken with a cold       forceps for evaluation of celiac disease. Estimated blood loss was       minimal.      The Z-line was regular. Estimated blood loss: none.      Esophagogastric landmarks were identified: the gastroesophageal junction       was found at  35 cm from the incisors.      The exam of the esophagus was otherwise normal.      Localized moderate inflammation characterized by erosions was found in       the gastric antrum. Biopsies were taken with a cold forceps for       histology. Estimated blood loss was minimal.      A medium-sized, submucosal, non-circumferential mass with no bleeding       and no stigmata of recent bleeding was found in the gastric antrum.       Estimated blood loss: none.      Normal mucosa was found in the gastric body. Biopsies were taken with a       cold forceps for Helicobacter pylori testing. Estimated blood loss was       minimal.      The exam of the stomach was otherwise normal. Impression:            - Normal duodenal bulb, first portion of the duodenum                         and second portion of the duodenum. Biopsied.                        - Z-line regular.                        - Esophagogastric landmarks identified.                        - Gastritis. Biopsied.                        - Gastric lesion in the gastric antrum.                        - Normal mucosa was found in the gastric body.                         Biopsied. Recommendation:        - Patient has a contact number available for  emergencies. The signs and symptoms of potential                         delayed complications were discussed with the patient.                         Return to normal activities tomorrow. Written                         discharge instructions were provided to the patient.                        - Discharge patient to home.                        - Resume previous diet.                        - Continue present medications.                        - Await pathology results.                        - Consider Endoscopic ultrasound to evaluate gastric                         lesion.                        - Return to GI clinic as previously scheduled.                        -  The findings and recommendations were discussed with                         the patient.                        - proceed with colonoscopy. see report for further                         recommendations. Procedure Code(s):     --- Professional ---                        [phone], Esophagogastroduodenoscopy, flexible,                         transoral; with biopsy, single or multiple Diagnosis Code(s):     --- Professional ---                        K29.70, Gastritis, unspecified, without bleeding                        D49.0, Neoplasm of unspecified behavior of digestive                         system                        R11.2, Nausea with vomiting, unspecified CPT copyright 2022 American [employer] Association. All  rights reserved. The codes documented in this report are preliminary and upon coder review may  be revised to meet current compliance requirements. Attending Participation:      I personally performed the entire procedure. Polo Brisk, DO [name] DO, DO 12/07/2023 2:25:04 PM This report has been signed electronically. Number of Addenda: 0 Note Initiated On: 12/07/2023 1:54 PM Estimated Blood Loss:  Estimated blood loss was minimal.      [facility]

## 2023-12-07 NOTE — Anesthesia Procedure Notes (Signed)
 Procedure Name: General with mask airway Date/Time: 12/07/2023 2:14 PM  Performed by: Niki Barter, CRNAPre-anesthesia Checklist: Patient identified, Emergency Drugs available, Suction available and Patient being monitored Patient Re-evaluated:Patient Re-evaluated prior to induction Oxygen Delivery Method: Simple face mask Induction Type: IV induction Placement Confirmation: positive ETCO2 and breath sounds checked- equal and bilateral Dental Injury: Teeth and Oropharynx as per pre-operative assessment

## 2023-12-07 NOTE — Transfer of Care (Signed)
 Immediate Anesthesia Transfer of Care Note  Patient: Sherry Fields  Procedure(s) Performed: COLONOSCOPY WITH PROPOFOL  ESOPHAGOGASTRODUODENOSCOPY (EGD) WITH PROPOFOL  POLYPECTOMY, INTESTINE  Patient Location: Endoscopy Unit  Anesthesia Type:General  Level of Consciousness: drowsy and patient cooperative  Airway & Oxygen Therapy: Patient Spontanous Breathing and Patient connected to face mask oxygen  Post-op Assessment: Report given to RN and Post -op Vital signs reviewed and stable  Post vital signs: Reviewed and stable  Last Vitals:  Vitals Value Taken Time  BP 112/71 12/07/23 1454  Temp    Pulse 66 12/07/23 1454  Resp 13 12/07/23 1454  SpO2 100 % 12/07/23 1454    Last Pain:  Vitals:   12/07/23 1454  TempSrc:   PainSc: Asleep         Complications: No notable events documented.

## 2023-12-07 NOTE — Anesthesia Preprocedure Evaluation (Signed)
 Anesthesia Evaluation  Patient identified by MRN, date of birth, ID band Patient awake    Reviewed: Allergy & Precautions, NPO status , Patient's Chart, lab work & pertinent test results  History of Anesthesia Complications Negative for: history of anesthetic complications  Airway Mallampati: II  TM Distance: >3 FB Neck ROM: Full    Dental  (+) Edentulous Upper, Edentulous Lower   Pulmonary neg pulmonary ROS, neg sleep apnea, neg COPD, Patient abstained from smoking.Not current smoker   Pulmonary exam normal breath sounds clear to auscultation       Cardiovascular Exercise Tolerance: Good METShypertension, Pt. on medications (-) CAD and (-) Past MI (-) dysrhythmias  Rhythm:Regular Rate:Normal - Systolic murmurs    Neuro/Psych neg Seizures PSYCHIATRIC DISORDERS Anxiety Depression    negative neurological ROS     GI/Hepatic ,neg GERD  ,,(+)     (-) substance abuse    Endo/Other  diabetes, Well ControlledHypothyroidism  Off GLP1 agonist for 3 weeks. 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  Reproductive/Obstetrics                             Anesthesia Physical Anesthesia Plan  ASA: 2  Anesthesia Plan: General   Post-op Pain Management: Minimal or no pain anticipated   Induction: Intravenous  PONV Risk Score and Plan: 3 and Propofol  infusion, TIVA and Ondansetron   Airway Management Planned: Nasal Cannula  Additional Equipment: None  Intra-op Plan:   Post-operative Plan:   Informed Consent: I have reviewed the patients History and Physical, chart,  labs and discussed the procedure including the risks, benefits and alternatives for the proposed anesthesia with the patient or authorized representative who has indicated his/her understanding and acceptance.     Dental advisory given  Plan Discussed with: CRNA and Surgeon  Anesthesia Plan Comments: (Discussed risks of anesthesia with patient, including possibility of difficulty with spontaneous ventilation under anesthesia necessitating airway intervention, PONV, and rare risks such as cardiac or respiratory or neurological events, and allergic reactions. Discussed the role of CRNA in patient's perioperative care. Patient understands.)       Anesthesia Quick Evaluation

## 2023-12-07 NOTE — H&P (Signed)
 Pre-Procedure H&P   Patient ID: Sherry Fields is a 68 y.o. female.  Gastroenterology Provider: [name], DO  Referring Provider: Laquetta Plank, PA PCP: Omie Bickers, MD  Date: 12/07/2023  HPI [name] is a 68 y.o. female who presents today for Esophagogastroduodenoscopy and Colonoscopy for GERD, nausea, vomiting, change in bowel habits, positive Cologuard .  Abdominal cramping better with defecation.  No melena or hematochezia.  She has had alternating bowel movements since her cholecystectomy 3 years ago with them being loose now.  Persistent n/v.  Hgb 13.3 mcv 85 plt 255K cr 0.76  Patient with positive Cologuard January 2025.  No previous endoscopy or colonoscopy  No fhx crc or colon polyps  Patient on Trulicity which has been held for this procedure (3 weeks)   Past Medical History:  Diagnosis Date   Anxiety    Depression    Diabetes mellitus    Hyperlipidemia    Hypertension    Hypothyroid    Insomnia    Seizure disorder (HCC)    Toxic nodular goiter     Past Surgical History:  Procedure Laterality Date   BREAST BIOPSY Right 2020   neg   CHOLECYSTECTOMY N/A 10/17/2016   Procedure: LAPAROSCOPIC CHOLECYSTECTOMY  (no gram);  Surgeon: Alben Alma, MD;  Location: [facility] ORS;  Service: General;  Laterality: N/A;   THYROIDECTOMY      Family History No h/o GI disease or malignancy  Review of Systems  Constitutional:  Negative for activity change, appetite change, chills, diaphoresis, fatigue, fever and unexpected weight change.  HENT:  Negative for trouble swallowing and voice change.   Respiratory:  Negative for shortness of breath and wheezing.   Cardiovascular:  Negative for chest pain, palpitations and leg swelling.  Gastrointestinal:  Positive for abdominal pain, constipation, diarrhea, nausea and vomiting. Negative for abdominal distention, anal bleeding, blood in stool and rectal pain.  Musculoskeletal:  Negative for arthralgias and  myalgias.  Skin:  Negative for color change and pallor.  Neurological:  Negative for dizziness, syncope and weakness.  Psychiatric/Behavioral:  Negative for confusion.   All other systems reviewed and are negative.    Medications No current facility-administered medications on file prior to encounter.   Current Outpatient Medications on File Prior to Encounter  Medication Sig Dispense Refill   atorvastatin (LIPITOR) 20 MG tablet Take 20 mg by mouth daily.     EPINEPHrine  (EPIPEN ) 0.3 mg/0.3 mL DEVI Inject 0.3 mg into the muscle once.     glipiZIDE (GLUCOTROL) 10 MG tablet Take 10 mg by mouth daily.     ibuprofen  (ADVIL ) 800 MG tablet Take 1 tablet (800 mg total) by mouth every 8 (eight) hours as needed. 90 tablet 5   JARDIANCE 10 MG TABS tablet Take 10 mg by mouth daily. (Patient not taking: Reported on 12/07/2023)     levothyroxine (SYNTHROID) 88 MCG tablet Take 88 mcg by mouth daily before breakfast.     loperamide  (IMODIUM ) 2 MG capsule Take 1 capsule (2 mg total) by mouth 4 (four) times daily as needed for diarrhea or loose stools. 12 capsule 0   losartan  (COZAAR ) 50 MG tablet Take 50 mg by mouth at bedtime.       omeprazole (PRILOSEC) 40 MG capsule Take 40 mg by mouth as needed.     SYNJARDY 12.11-998 MG TABS Take 1 tablet by mouth 2 (two) times daily.     zolpidem (AMBIEN) 10 MG tablet Take 10 mg by  mouth at bedtime. For sleep      Pertinent medications related to GI and procedure were reviewed by me with the patient prior to the procedure  No current facility-administered medications for this encounter.      Allergies  Allergen Reactions   Naproxen  Anaphylaxis   Poison Sumac Extract Hives and Rash   Allergies were reviewed by me prior to the procedure  Objective   Body mass index is 24.16 kg/m. Vitals:   12/07/23 1326 12/07/23 1333  BP:  (!) 156/89  Pulse:  78  Resp:  16  Temp:  (!) 97.4 F (36.3 C)  TempSrc:  Temporal  SpO2:  100%  Weight: 61.9 kg   Height:  5\' 3"  (1.6 m)      Physical Exam Vitals and nursing note reviewed.  Constitutional:      General: She is not in acute distress.    Appearance: Normal appearance. She is not ill-appearing, toxic-appearing or diaphoretic.  HENT:     Head: Normocephalic and atraumatic.     Nose: Nose normal.     Mouth/Throat:     Mouth: Mucous membranes are moist.     Pharynx: Oropharynx is clear.  Eyes:     General: No scleral icterus.    Extraocular Movements: Extraocular movements intact.  Cardiovascular:     Rate and Rhythm: Normal rate and regular rhythm.     Heart sounds: Normal heart sounds. No murmur heard.    No friction rub. No gallop.  Pulmonary:     Effort: Pulmonary effort is normal. No respiratory distress.     Breath sounds: Normal breath sounds. No wheezing, rhonchi or rales.  Abdominal:     General: Bowel sounds are normal. There is no distension.     Palpations: Abdomen is soft.     Tenderness: There is no abdominal tenderness. There is no guarding or rebound.  Musculoskeletal:     Cervical back: Neck supple.     Right lower leg: No edema.     Left lower leg: No edema.  Skin:    General: Skin is warm and dry.     Coloration: Skin is not jaundiced or pale.  Neurological:     General: No focal deficit present.     Mental Status: She is alert and oriented to person, place, and time. Mental status is at baseline.  Psychiatric:        Mood and Affect: Mood normal.        Behavior: Behavior normal.        Thought Content: Thought content normal.        Judgment: Judgment normal.      Assessment:  [name] is a 68 y.o. female  who presents today for Esophagogastroduodenoscopy and Colonoscopy for GERD, nausea, vomiting, change in bowel habits, positive Cologuard .  Plan:  Esophagogastroduodenoscopy and Colonoscopy with possible intervention today  Esophagogastroduodenoscopy and Colonoscopy with possible biopsy, control of bleeding, polypectomy, and interventions as  necessary has been discussed with the patient/patient representative. Informed consent was obtained from the patient/patient representative after explaining the indication, nature, and risks of the procedure including but not limited to death, bleeding, perforation, missed neoplasm/lesions, cardiorespiratory compromise, and reaction to medications. Opportunity for questions was given and appropriate answers were provided. Patient/patient representative has verbalized understanding is amenable to undergoing the procedure.   [name], DO  [facility] Gastroenterology  Portions of the record may have been created with voice recognition software. Occasional wrong-word or '  sound-a-like' substitutions may have occurred due to the inherent limitations of voice recognition software.  Read the chart carefully and recognize, using context, where substitutions may have occurred.

## 2023-12-07 NOTE — Interval H&P Note (Signed)
 History and Physical Interval Note:Preprocedure H&P from 12/07/23  was reviewed and there was no interval change after seeing and examining the patient.  Written consent was obtained from the patient after discussion of risks, benefits, and alternatives. Patient has consented to proceed with Esophagogastroduodenoscopy and Colonoscopy with possible intervention   12/07/2023 2:04 PM  Sherry Fields  has presented today for surgery, with the diagnosis of R19.5 (ICD-10-CM) - Positive colorectal cancer screening using Cologuard test R19.4 (ICD-10-CM) - Change in bowel habits K21.9 (ICD-10-CM) - Gastroesophageal reflux disease, unspecified whether esophagitis present R11.2 (ICD-10-CM) - Nausea and vomiting, unspecified vomiting type.  The various methods of treatment have been discussed with the patient and family. After consideration of risks, benefits and other options for treatment, the patient has consented to  Procedure(s) with comments: COLONOSCOPY WITH PROPOFOL  (N/A) - DM DAUGHTER WANTED A LATER TIME. ESOPHAGOGASTRODUODENOSCOPY (EGD) WITH PROPOFOL  (N/A) as a surgical intervention.  The patient's history has been reviewed, patient examined, no change in status, stable for surgery.  I have reviewed the patient's chart and labs.  Questions were answered to the patient's satisfaction.     [name]

## 2023-12-08 ENCOUNTER — Encounter: Payer: Self-pay | Admitting: Gastroenterology

## 2023-12-08 LAB — SURGICAL PATHOLOGY

## 2023-12-08 NOTE — Anesthesia Postprocedure Evaluation (Signed)
 Anesthesia Post Note  Patient: Sherry Fields  Procedure(s) Performed: COLONOSCOPY WITH PROPOFOL  ESOPHAGOGASTRODUODENOSCOPY (EGD) WITH PROPOFOL  POLYPECTOMY, INTESTINE CONTROL OF HEMORRHAGE, GI TRACT, ENDOSCOPIC, BY CLIPPING OR OVERSEWING  Patient location during evaluation: Endoscopy Anesthesia Type: General Level of consciousness: awake and alert Pain management: pain level controlled Vital Signs Assessment: post-procedure vital signs reviewed and stable Respiratory status: spontaneous breathing, nonlabored ventilation, respiratory function stable and patient connected to nasal cannula oxygen Cardiovascular status: blood pressure returned to baseline and stable Postop Assessment: no apparent nausea or vomiting Anesthetic complications: no   No notable events documented.   Last Vitals:  Vitals:   12/07/23 1514 12/07/23 1524  BP: 125/83 131/83  Pulse:    Resp:    Temp:    SpO2:  100%    Last Pain:  Vitals:   12/07/23 1454  TempSrc:   PainSc: Asleep                 Lattie Poli

## 2023-12-28 DIAGNOSIS — K219 Gastro-esophageal reflux disease without esophagitis: Secondary | ICD-10-CM | POA: Diagnosis not present

## 2023-12-28 DIAGNOSIS — Z79899 Other long term (current) drug therapy: Secondary | ICD-10-CM | POA: Diagnosis not present

## 2023-12-28 DIAGNOSIS — Z860101 Personal history of adenomatous and serrated colon polyps: Secondary | ICD-10-CM | POA: Diagnosis not present

## 2023-12-28 DIAGNOSIS — E039 Hypothyroidism, unspecified: Secondary | ICD-10-CM | POA: Diagnosis not present

## 2023-12-28 DIAGNOSIS — E119 Type 2 diabetes mellitus without complications: Secondary | ICD-10-CM | POA: Diagnosis not present

## 2023-12-28 DIAGNOSIS — Z7984 Long term (current) use of oral hypoglycemic drugs: Secondary | ICD-10-CM | POA: Diagnosis not present

## 2023-12-28 DIAGNOSIS — K2970 Gastritis, unspecified, without bleeding: Secondary | ICD-10-CM | POA: Diagnosis not present

## 2023-12-28 DIAGNOSIS — K2960 Other gastritis without bleeding: Secondary | ICD-10-CM | POA: Diagnosis not present

## 2023-12-28 DIAGNOSIS — I10 Essential (primary) hypertension: Secondary | ICD-10-CM | POA: Diagnosis not present

## 2023-12-28 DIAGNOSIS — Z9049 Acquired absence of other specified parts of digestive tract: Secondary | ICD-10-CM | POA: Diagnosis not present

## 2023-12-28 DIAGNOSIS — K3189 Other diseases of stomach and duodenum: Secondary | ICD-10-CM | POA: Diagnosis not present

## 2023-12-28 DIAGNOSIS — Z87891 Personal history of nicotine dependence: Secondary | ICD-10-CM | POA: Diagnosis not present

## 2024-01-09 DIAGNOSIS — D369 Benign neoplasm, unspecified site: Secondary | ICD-10-CM | POA: Diagnosis not present

## 2024-02-05 DIAGNOSIS — E119 Type 2 diabetes mellitus without complications: Secondary | ICD-10-CM | POA: Diagnosis not present

## 2024-02-05 DIAGNOSIS — E890 Postprocedural hypothyroidism: Secondary | ICD-10-CM | POA: Diagnosis not present

## 2024-02-05 DIAGNOSIS — K219 Gastro-esophageal reflux disease without esophagitis: Secondary | ICD-10-CM | POA: Diagnosis not present

## 2024-02-05 DIAGNOSIS — Z9049 Acquired absence of other specified parts of digestive tract: Secondary | ICD-10-CM | POA: Diagnosis not present

## 2024-02-05 DIAGNOSIS — K644 Residual hemorrhoidal skin tags: Secondary | ICD-10-CM | POA: Diagnosis not present

## 2024-02-05 DIAGNOSIS — I10 Essential (primary) hypertension: Secondary | ICD-10-CM | POA: Diagnosis not present

## 2024-02-05 DIAGNOSIS — Z79899 Other long term (current) drug therapy: Secondary | ICD-10-CM | POA: Diagnosis not present

## 2024-02-05 DIAGNOSIS — K5730 Diverticulosis of large intestine without perforation or abscess without bleeding: Secondary | ICD-10-CM | POA: Diagnosis not present

## 2024-02-05 DIAGNOSIS — Z09 Encounter for follow-up examination after completed treatment for conditions other than malignant neoplasm: Secondary | ICD-10-CM | POA: Diagnosis not present

## 2024-02-05 DIAGNOSIS — D120 Benign neoplasm of cecum: Secondary | ICD-10-CM | POA: Diagnosis not present

## 2024-02-05 DIAGNOSIS — K648 Other hemorrhoids: Secondary | ICD-10-CM | POA: Diagnosis not present

## 2024-02-05 DIAGNOSIS — D122 Benign neoplasm of ascending colon: Secondary | ICD-10-CM | POA: Diagnosis not present

## 2024-02-05 DIAGNOSIS — K3189 Other diseases of stomach and duodenum: Secondary | ICD-10-CM | POA: Diagnosis not present

## 2024-02-05 DIAGNOSIS — Z1211 Encounter for screening for malignant neoplasm of colon: Secondary | ICD-10-CM | POA: Diagnosis not present

## 2024-02-05 DIAGNOSIS — Z860100 Personal history of colon polyps, unspecified: Secondary | ICD-10-CM | POA: Diagnosis not present

## 2024-02-05 DIAGNOSIS — Z860101 Personal history of adenomatous and serrated colon polyps: Secondary | ICD-10-CM | POA: Diagnosis not present

## 2024-03-07 DIAGNOSIS — E782 Mixed hyperlipidemia: Secondary | ICD-10-CM | POA: Diagnosis not present

## 2024-03-07 DIAGNOSIS — E039 Hypothyroidism, unspecified: Secondary | ICD-10-CM | POA: Diagnosis not present

## 2024-03-07 DIAGNOSIS — E1165 Type 2 diabetes mellitus with hyperglycemia: Secondary | ICD-10-CM | POA: Diagnosis not present

## 2024-03-14 DIAGNOSIS — E039 Hypothyroidism, unspecified: Secondary | ICD-10-CM | POA: Diagnosis not present

## 2024-03-14 DIAGNOSIS — E1165 Type 2 diabetes mellitus with hyperglycemia: Secondary | ICD-10-CM | POA: Diagnosis not present

## 2024-03-14 DIAGNOSIS — E782 Mixed hyperlipidemia: Secondary | ICD-10-CM | POA: Diagnosis not present

## 2024-03-14 DIAGNOSIS — R051 Acute cough: Secondary | ICD-10-CM | POA: Diagnosis not present

## 2024-03-14 DIAGNOSIS — E876 Hypokalemia: Secondary | ICD-10-CM | POA: Diagnosis not present

## 2024-03-14 DIAGNOSIS — K219 Gastro-esophageal reflux disease without esophagitis: Secondary | ICD-10-CM | POA: Diagnosis not present

## 2024-03-14 DIAGNOSIS — J309 Allergic rhinitis, unspecified: Secondary | ICD-10-CM | POA: Diagnosis not present

## 2024-03-14 DIAGNOSIS — R110 Nausea: Secondary | ICD-10-CM | POA: Diagnosis not present

## 2024-03-14 DIAGNOSIS — I10 Essential (primary) hypertension: Secondary | ICD-10-CM | POA: Diagnosis not present

## 2024-03-22 ENCOUNTER — Encounter: Payer: Self-pay | Admitting: Radiology

## 2024-04-25 ENCOUNTER — Ambulatory Visit

## 2024-05-23 ENCOUNTER — Ambulatory Visit

## 2024-05-30 ENCOUNTER — Ambulatory Visit: Admitting: General Surgery

## 2024-06-03 ENCOUNTER — Encounter: Payer: Self-pay | Admitting: Radiology

## 2024-08-21 ENCOUNTER — Telehealth: Admitting: Emergency Medicine

## 2024-08-21 ENCOUNTER — Encounter

## 2024-08-21 DIAGNOSIS — K5792 Diverticulitis of intestine, part unspecified, without perforation or abscess without bleeding: Secondary | ICD-10-CM

## 2024-08-21 MED ORDER — AMOXICILLIN-POT CLAVULANATE 875-125 MG PO TABS: 1.0000 | ORAL_TABLET | Freq: Two times a day (BID) | ORAL | 0 refills | Status: AC

## 2024-08-21 NOTE — Progress Notes (Signed)
 " Virtual Visit Consent   Sherry Fields, you are scheduled for a virtual visit with a [facility] provider today. Just as with appointments in the office, your consent must be obtained to participate. Your consent will be active for this visit and any virtual visit you may have with one of our providers in the next 365 days. If you have a MyChart account, a copy of this consent can be sent to you electronically.  As this is a virtual visit, video technology does not allow for your provider to perform a traditional examination. This may limit your provider's ability to fully assess your condition. If your provider identifies any concerns that need to be evaluated in person or the need to arrange testing (such as labs, EKG, etc.), we will make arrangements to do so. Although advances in technology are sophisticated, we cannot ensure that it will always work on either your end or our end. If the connection with a video visit is poor, the visit may have to be switched to a telephone visit. With either a video or telephone visit, we are not always able to ensure that we have a secure connection.  By engaging in this virtual visit, you consent to the provision of healthcare and authorize for your insurance to be billed (if applicable) for the services provided during this visit. Depending on your insurance coverage, you may receive a charge related to this service.  I need to obtain your verbal consent now. Are you willing to proceed with your visit today? Sherry Fields has provided verbal consent on 08/21/2024 for a virtual visit (video or telephone). Jon CHRISTELLA Belt, NP  Date: 08/21/2024 6:12 PM   Virtual Visit via Video Note   I, Jon CHRISTELLA Belt, connected with  Sherry Fields  ([id number], [birth year]) on 08/21/24 at  6:00 PM EST by a video-enabled telemedicine application and verified that I am speaking with the correct person using two identifiers.  Location: Patient: Virtual Visit Location Patient:  Home Provider: Virtual Visit Location Provider: Home Office   I discussed the limitations of evaluation and management by telemedicine and the availability of in person appointments. The patient expressed understanding and agreed to proceed.    History of Present Illness: Sherry Fields is a 69 y.o. who identifies as a female who was assigned female at birth, and is being seen today for diverticulitis. Last flare about a year ago, took antibiotics for it at that time. Having diarrhea - if she eats, it comes right out, same as last flare. No fever or chills, no nausea/vomiting. Sx x 3 days. Hx cholecystectomy, no other abd surgery.   BLQ abd pain  Colonoscopy report from May 2025 showed diverticulosis throughout colon.   HPI: HPI  Problems:  Patient Active Problem List   Diagnosis Date Noted   Diabetes 1.5, managed as type 1 (HCC) 09/29/2014   Diabetes mellitus    Hypertension    Hypothyroid    Insomnia    Hyperlipidemia     Allergies: Allergies[1] Medications: Current Medications[2]  Observations/Objective: Patient is well-developed, well-nourished in no acute distress.  Resting comfortably  at home. She is holding her belly.  Head is normocephalic, atraumatic.  No labored breathing.  Speech is clear and coherent with logical content.  Patient is alert and oriented at baseline.    Assessment and Plan: 1. Diverticulitis (Primary) - amoxicillin -clavulanate (AUGMENTIN ) 875-125 MG tablet; Take 1 tablet by mouth 2 (two) times daily.  Dispense: 14 tablet; Refill:  0  Pt will seek in person care if no improvement or if sx worsen.   Follow Up Instructions: I discussed the assessment and treatment plan with the patient. The patient was provided an opportunity to ask questions and all were answered. The patient agreed with the plan and demonstrated an understanding of the instructions.  A copy of instructions were sent to the patient via MyChart unless otherwise noted below.   The  patient was advised to call back or seek an in-person evaluation if the symptoms worsen or if the condition fails to improve as anticipated.    Jon CHRISTELLA Belt, NP     [1]  Allergies Allergen Reactions   Naproxen  Anaphylaxis   Poison Sumac Extract Hives and Rash  [2]  Current Outpatient Medications:    amoxicillin -clavulanate (AUGMENTIN ) 875-125 MG tablet, Take 1 tablet by mouth 2 (two) times daily., Disp: 14 tablet, Rfl: 0   atorvastatin (LIPITOR) 20 MG tablet, Take 20 mg by mouth daily., Disp: , Rfl:    Dulaglutide (TRULICITY) 0.75 MG/0.5ML SOAJ, Inject 0.75 mg into the skin once a week., Disp: , Rfl:    EPINEPHrine  (EPIPEN ) 0.3 mg/0.3 mL DEVI, Inject 0.3 mg into the muscle once., Disp: , Rfl:    glipiZIDE (GLUCOTROL) 10 MG tablet, Take 10 mg by mouth daily., Disp: , Rfl:    ibuprofen  (ADVIL ) 800 MG tablet, Take 1 tablet (800 mg total) by mouth every 8 (eight) hours as needed., Disp: 90 tablet, Rfl: 5   JARDIANCE 10 MG TABS tablet, Take 10 mg by mouth daily. (Patient not taking: Reported on 12/07/2023), Disp: , Rfl:    levothyroxine (SYNTHROID) 88 MCG tablet, Take 88 mcg by mouth daily before breakfast., Disp: , Rfl:    loperamide  (IMODIUM ) 2 MG capsule, Take 1 capsule (2 mg total) by mouth 4 (four) times daily as needed for diarrhea or loose stools., Disp: 12 capsule, Rfl: 0   losartan  (COZAAR ) 50 MG tablet, Take 50 mg by mouth at bedtime.  , Disp: , Rfl:    omeprazole (PRILOSEC) 40 MG capsule, Take 40 mg by mouth as needed., Disp: , Rfl:    SYNJARDY 12.11-998 MG TABS, Take 1 tablet by mouth 2 (two) times daily., Disp: , Rfl:    zolpidem (AMBIEN) 10 MG tablet, Take 10 mg by mouth at bedtime. For sleep, Disp: , Rfl:   "

## 2024-08-21 NOTE — Patient Instructions (Signed)
 " Andres JONETTA Queen, thank you for joining Jon CHRISTELLA Belt, NP for today's virtual visit.  While this provider is not your primary care provider (PCP), if your PCP is located in our provider database this encounter information will be shared with them immediately following your visit.   A [facility] MyChart account gives you access to today's visit and all your visits, tests, and labs performed at [facility]  click here if you don't have a [facility] MyChart account or go to [email or web address]  Consent: (Patient) ZAYLEE CORNIA provided verbal consent for this virtual visit at the beginning of the encounter.  Current Medications:  Current Outpatient Medications:    amoxicillin -clavulanate (AUGMENTIN ) 875-125 MG tablet, Take 1 tablet by mouth 2 (two) times daily., Disp: 14 tablet, Rfl: 0   atorvastatin (LIPITOR) 20 MG tablet, Take 20 mg by mouth daily., Disp: , Rfl:    Dulaglutide (TRULICITY) 0.75 MG/0.5ML SOAJ, Inject 0.75 mg into the skin once a week., Disp: , Rfl:    EPINEPHrine  (EPIPEN ) 0.3 mg/0.3 mL DEVI, Inject 0.3 mg into the muscle once., Disp: , Rfl:    glipiZIDE (GLUCOTROL) 10 MG tablet, Take 10 mg by mouth daily., Disp: , Rfl:    ibuprofen  (ADVIL ) 800 MG tablet, Take 1 tablet (800 mg total) by mouth every 8 (eight) hours as needed., Disp: 90 tablet, Rfl: 5   JARDIANCE 10 MG TABS tablet, Take 10 mg by mouth daily. (Patient not taking: Reported on 12/07/2023), Disp: , Rfl:    levothyroxine (SYNTHROID) 88 MCG tablet, Take 88 mcg by mouth daily before breakfast., Disp: , Rfl:    loperamide  (IMODIUM ) 2 MG capsule, Take 1 capsule (2 mg total) by mouth 4 (four) times daily as needed for diarrhea or loose stools., Disp: 12 capsule, Rfl: 0   losartan  (COZAAR ) 50 MG tablet, Take 50 mg by mouth at bedtime.  , Disp: , Rfl:    omeprazole (PRILOSEC) 40 MG capsule, Take 40 mg by mouth as needed., Disp: , Rfl:    SYNJARDY 12.11-998 MG TABS, Take 1 tablet by mouth 2 (two) times daily.,  Disp: , Rfl:    zolpidem (AMBIEN) 10 MG tablet, Take 10 mg by mouth at bedtime. For sleep, Disp: , Rfl:    Medications ordered in this encounter:  Meds ordered this encounter  Medications   amoxicillin -clavulanate (AUGMENTIN ) 875-125 MG tablet    Sig: Take 1 tablet by mouth 2 (two) times daily.    Dispense:  14 tablet    Refill:  0     *If you need refills on other medications prior to your next appointment, please contact your pharmacy*  Follow-Up: Call back or seek an in-person evaluation if the symptoms worsen or if the condition fails to improve as anticipated.  [facility] [phone]  Other Instructions If you are feeling worse or not getting better, please be checked in person.    If you have been instructed to have an in-person evaluation today at a local Urgent Care facility, please use the link below. It will take you to a list of all of our available [facility], including address, phone number and hours of operation. Please do not delay care.  [facility]  If you or a family member do not have a primary care provider, use the link below to schedule a visit and establish care. When you choose a [facility] primary care physician or advanced practice provider, you gain a long-term  partner in health. Find a Primary Care Provider  Learn more about [facility]'s in-office and virtual care options: [facility] Get Care Now  "
